# Patient Record
Sex: Male | Born: 1950 | Race: Black or African American | Hispanic: No | Marital: Married | Smoking: Former smoker
Health system: Southern US, Community
[De-identification: ages and names within clinical notes are randomized; demographics above are authoritative.]

## PROBLEM LIST (undated history)

## (undated) DIAGNOSIS — M109 Gout, unspecified: Secondary | ICD-10-CM

## (undated) DIAGNOSIS — E039 Hypothyroidism, unspecified: Secondary | ICD-10-CM

## (undated) DIAGNOSIS — E785 Hyperlipidemia, unspecified: Secondary | ICD-10-CM

## (undated) DIAGNOSIS — I2510 Atherosclerotic heart disease of native coronary artery without angina pectoris: Secondary | ICD-10-CM

## (undated) DIAGNOSIS — I4891 Unspecified atrial fibrillation: Secondary | ICD-10-CM

## (undated) DIAGNOSIS — N189 Chronic kidney disease, unspecified: Secondary | ICD-10-CM

## (undated) DIAGNOSIS — I619 Nontraumatic intracerebral hemorrhage, unspecified: Secondary | ICD-10-CM

## (undated) DIAGNOSIS — I10 Essential (primary) hypertension: Secondary | ICD-10-CM

## (undated) DIAGNOSIS — I639 Cerebral infarction, unspecified: Secondary | ICD-10-CM

## (undated) HISTORY — PX: CARDIAC CATHETERIZATION: SHX172

---

## 2000-04-25 ENCOUNTER — Encounter: Payer: Self-pay | Admitting: Emergency Medicine

## 2000-04-26 ENCOUNTER — Inpatient Hospital Stay (HOSPITAL_COMMUNITY): Admission: EM | Admit: 2000-04-26 | Discharge: 2000-04-28 | Payer: Self-pay | Admitting: Emergency Medicine

## 2001-06-18 ENCOUNTER — Emergency Department (HOSPITAL_COMMUNITY): Admission: EM | Admit: 2001-06-18 | Discharge: 2001-06-18 | Payer: Self-pay

## 2001-10-17 ENCOUNTER — Encounter: Payer: Self-pay | Admitting: Emergency Medicine

## 2001-10-17 ENCOUNTER — Inpatient Hospital Stay (HOSPITAL_COMMUNITY): Admission: EM | Admit: 2001-10-17 | Discharge: 2001-10-18 | Payer: Self-pay | Admitting: Emergency Medicine

## 2001-10-18 ENCOUNTER — Encounter: Payer: Self-pay | Admitting: General Surgery

## 2001-10-22 ENCOUNTER — Encounter: Payer: Self-pay | Admitting: General Surgery

## 2001-10-22 ENCOUNTER — Ambulatory Visit (HOSPITAL_COMMUNITY): Admission: RE | Admit: 2001-10-22 | Discharge: 2001-10-22 | Payer: Self-pay | Admitting: General Surgery

## 2001-10-30 ENCOUNTER — Encounter: Payer: Self-pay | Admitting: General Surgery

## 2001-10-30 ENCOUNTER — Ambulatory Visit (HOSPITAL_COMMUNITY): Admission: RE | Admit: 2001-10-30 | Discharge: 2001-10-30 | Payer: Self-pay | Admitting: General Surgery

## 2001-11-08 ENCOUNTER — Emergency Department (HOSPITAL_COMMUNITY): Admission: EM | Admit: 2001-11-08 | Discharge: 2001-11-08 | Payer: Self-pay | Admitting: Emergency Medicine

## 2001-11-16 ENCOUNTER — Ambulatory Visit (HOSPITAL_COMMUNITY): Admission: RE | Admit: 2001-11-16 | Discharge: 2001-11-16 | Payer: Self-pay | Admitting: Specialist

## 2001-11-16 ENCOUNTER — Encounter: Payer: Self-pay | Admitting: Specialist

## 2002-01-12 ENCOUNTER — Encounter: Payer: Self-pay | Admitting: Emergency Medicine

## 2002-01-12 ENCOUNTER — Emergency Department (HOSPITAL_COMMUNITY): Admission: EM | Admit: 2002-01-12 | Discharge: 2002-01-12 | Payer: Self-pay | Admitting: Emergency Medicine

## 2002-05-20 ENCOUNTER — Emergency Department (HOSPITAL_COMMUNITY): Admission: EM | Admit: 2002-05-20 | Discharge: 2002-05-20 | Payer: Self-pay | Admitting: Emergency Medicine

## 2002-11-11 ENCOUNTER — Encounter: Payer: Self-pay | Admitting: *Deleted

## 2002-11-12 ENCOUNTER — Inpatient Hospital Stay (HOSPITAL_COMMUNITY): Admission: EM | Admit: 2002-11-12 | Discharge: 2002-11-14 | Payer: Self-pay | Admitting: *Deleted

## 2002-11-12 ENCOUNTER — Encounter (INDEPENDENT_AMBULATORY_CARE_PROVIDER_SITE_OTHER): Payer: Self-pay | Admitting: Cardiology

## 2002-11-12 ENCOUNTER — Encounter: Payer: Self-pay | Admitting: Neurology

## 2002-11-26 ENCOUNTER — Encounter: Admission: RE | Admit: 2002-11-26 | Discharge: 2002-11-28 | Payer: Self-pay | Admitting: Neurology

## 2003-05-20 ENCOUNTER — Encounter: Admission: RE | Admit: 2003-05-20 | Discharge: 2003-05-20 | Payer: Self-pay | Admitting: Internal Medicine

## 2006-04-07 ENCOUNTER — Ambulatory Visit: Payer: Self-pay | Admitting: Pulmonary Disease

## 2006-04-07 ENCOUNTER — Encounter (INDEPENDENT_AMBULATORY_CARE_PROVIDER_SITE_OTHER): Payer: Self-pay | Admitting: Cardiovascular Disease

## 2006-04-07 ENCOUNTER — Inpatient Hospital Stay (HOSPITAL_COMMUNITY): Admission: EM | Admit: 2006-04-07 | Discharge: 2006-04-12 | Payer: Self-pay | Admitting: Emergency Medicine

## 2006-08-02 ENCOUNTER — Inpatient Hospital Stay (HOSPITAL_COMMUNITY): Admission: AD | Admit: 2006-08-02 | Discharge: 2006-08-09 | Payer: Self-pay | Admitting: Cardiovascular Disease

## 2006-08-03 ENCOUNTER — Encounter (INDEPENDENT_AMBULATORY_CARE_PROVIDER_SITE_OTHER): Payer: Self-pay | Admitting: Cardiovascular Disease

## 2007-05-02 ENCOUNTER — Inpatient Hospital Stay (HOSPITAL_COMMUNITY): Admission: EM | Admit: 2007-05-02 | Discharge: 2007-05-08 | Payer: Self-pay | Admitting: Emergency Medicine

## 2007-05-07 ENCOUNTER — Encounter (INDEPENDENT_AMBULATORY_CARE_PROVIDER_SITE_OTHER): Payer: Self-pay | Admitting: Cardiovascular Disease

## 2007-05-07 ENCOUNTER — Ambulatory Visit: Payer: Self-pay | Admitting: Vascular Surgery

## 2008-05-18 ENCOUNTER — Inpatient Hospital Stay (HOSPITAL_COMMUNITY): Admission: AD | Admit: 2008-05-18 | Discharge: 2008-05-23 | Payer: Self-pay | Admitting: Neurology

## 2008-05-19 ENCOUNTER — Encounter (INDEPENDENT_AMBULATORY_CARE_PROVIDER_SITE_OTHER): Payer: Self-pay | Admitting: Cardiovascular Disease

## 2008-05-21 ENCOUNTER — Ambulatory Visit: Payer: Self-pay | Admitting: Physical Medicine & Rehabilitation

## 2008-05-23 ENCOUNTER — Inpatient Hospital Stay (HOSPITAL_COMMUNITY)
Admission: RE | Admit: 2008-05-23 | Discharge: 2008-06-06 | Payer: Self-pay | Admitting: Physical Medicine & Rehabilitation

## 2008-07-15 ENCOUNTER — Encounter
Admission: RE | Admit: 2008-07-15 | Discharge: 2008-10-13 | Payer: Self-pay | Admitting: Physical Medicine & Rehabilitation

## 2008-07-16 ENCOUNTER — Ambulatory Visit: Payer: Self-pay | Admitting: Physical Medicine & Rehabilitation

## 2008-08-27 ENCOUNTER — Ambulatory Visit: Payer: Self-pay | Admitting: Physical Medicine & Rehabilitation

## 2008-12-22 ENCOUNTER — Encounter
Admission: RE | Admit: 2008-12-22 | Discharge: 2009-03-22 | Payer: Self-pay | Admitting: Physical Medicine & Rehabilitation

## 2008-12-24 ENCOUNTER — Ambulatory Visit: Payer: Self-pay | Admitting: Physical Medicine & Rehabilitation

## 2009-02-13 ENCOUNTER — Ambulatory Visit: Payer: Self-pay | Admitting: Physical Medicine & Rehabilitation

## 2009-03-24 ENCOUNTER — Encounter
Admission: RE | Admit: 2009-03-24 | Discharge: 2009-03-27 | Payer: Self-pay | Admitting: Physical Medicine & Rehabilitation

## 2009-03-27 ENCOUNTER — Ambulatory Visit: Payer: Self-pay | Admitting: Physical Medicine & Rehabilitation

## 2009-04-15 ENCOUNTER — Inpatient Hospital Stay (HOSPITAL_COMMUNITY): Admission: EM | Admit: 2009-04-15 | Discharge: 2009-04-17 | Payer: Self-pay | Admitting: Emergency Medicine

## 2009-04-16 ENCOUNTER — Encounter (INDEPENDENT_AMBULATORY_CARE_PROVIDER_SITE_OTHER): Payer: Self-pay | Admitting: Cardiovascular Disease

## 2009-07-07 ENCOUNTER — Encounter
Admission: RE | Admit: 2009-07-07 | Discharge: 2009-07-09 | Payer: Self-pay | Admitting: Physical Medicine & Rehabilitation

## 2009-07-07 ENCOUNTER — Ambulatory Visit: Payer: Self-pay | Admitting: Physical Medicine & Rehabilitation

## 2009-09-28 ENCOUNTER — Encounter
Admission: RE | Admit: 2009-09-28 | Discharge: 2009-10-12 | Payer: Self-pay | Admitting: Physical Medicine & Rehabilitation

## 2009-09-29 ENCOUNTER — Ambulatory Visit: Payer: Self-pay | Admitting: Physical Medicine & Rehabilitation

## 2009-10-12 ENCOUNTER — Ambulatory Visit: Payer: Self-pay | Admitting: Physical Medicine & Rehabilitation

## 2010-01-29 ENCOUNTER — Encounter
Admission: RE | Admit: 2010-01-29 | Discharge: 2010-04-29 | Payer: Self-pay | Admitting: Physical Medicine & Rehabilitation

## 2010-02-08 ENCOUNTER — Ambulatory Visit: Payer: Self-pay | Admitting: Physical Medicine & Rehabilitation

## 2010-03-16 ENCOUNTER — Ambulatory Visit: Payer: Self-pay | Admitting: Physical Medicine & Rehabilitation

## 2010-03-22 ENCOUNTER — Emergency Department (HOSPITAL_COMMUNITY): Admission: EM | Admit: 2010-03-22 | Discharge: 2010-03-22 | Payer: Self-pay | Admitting: Emergency Medicine

## 2010-04-14 IMAGING — CR DG CHEST 1V PORT
1 series · 1 of 1 positions shown · non-contrast
Comparison: 05/01/2007

CLINICAL DATA: Fever.  Stroke.

PORTABLE CHEST - 1 VIEW

[view not recorded]
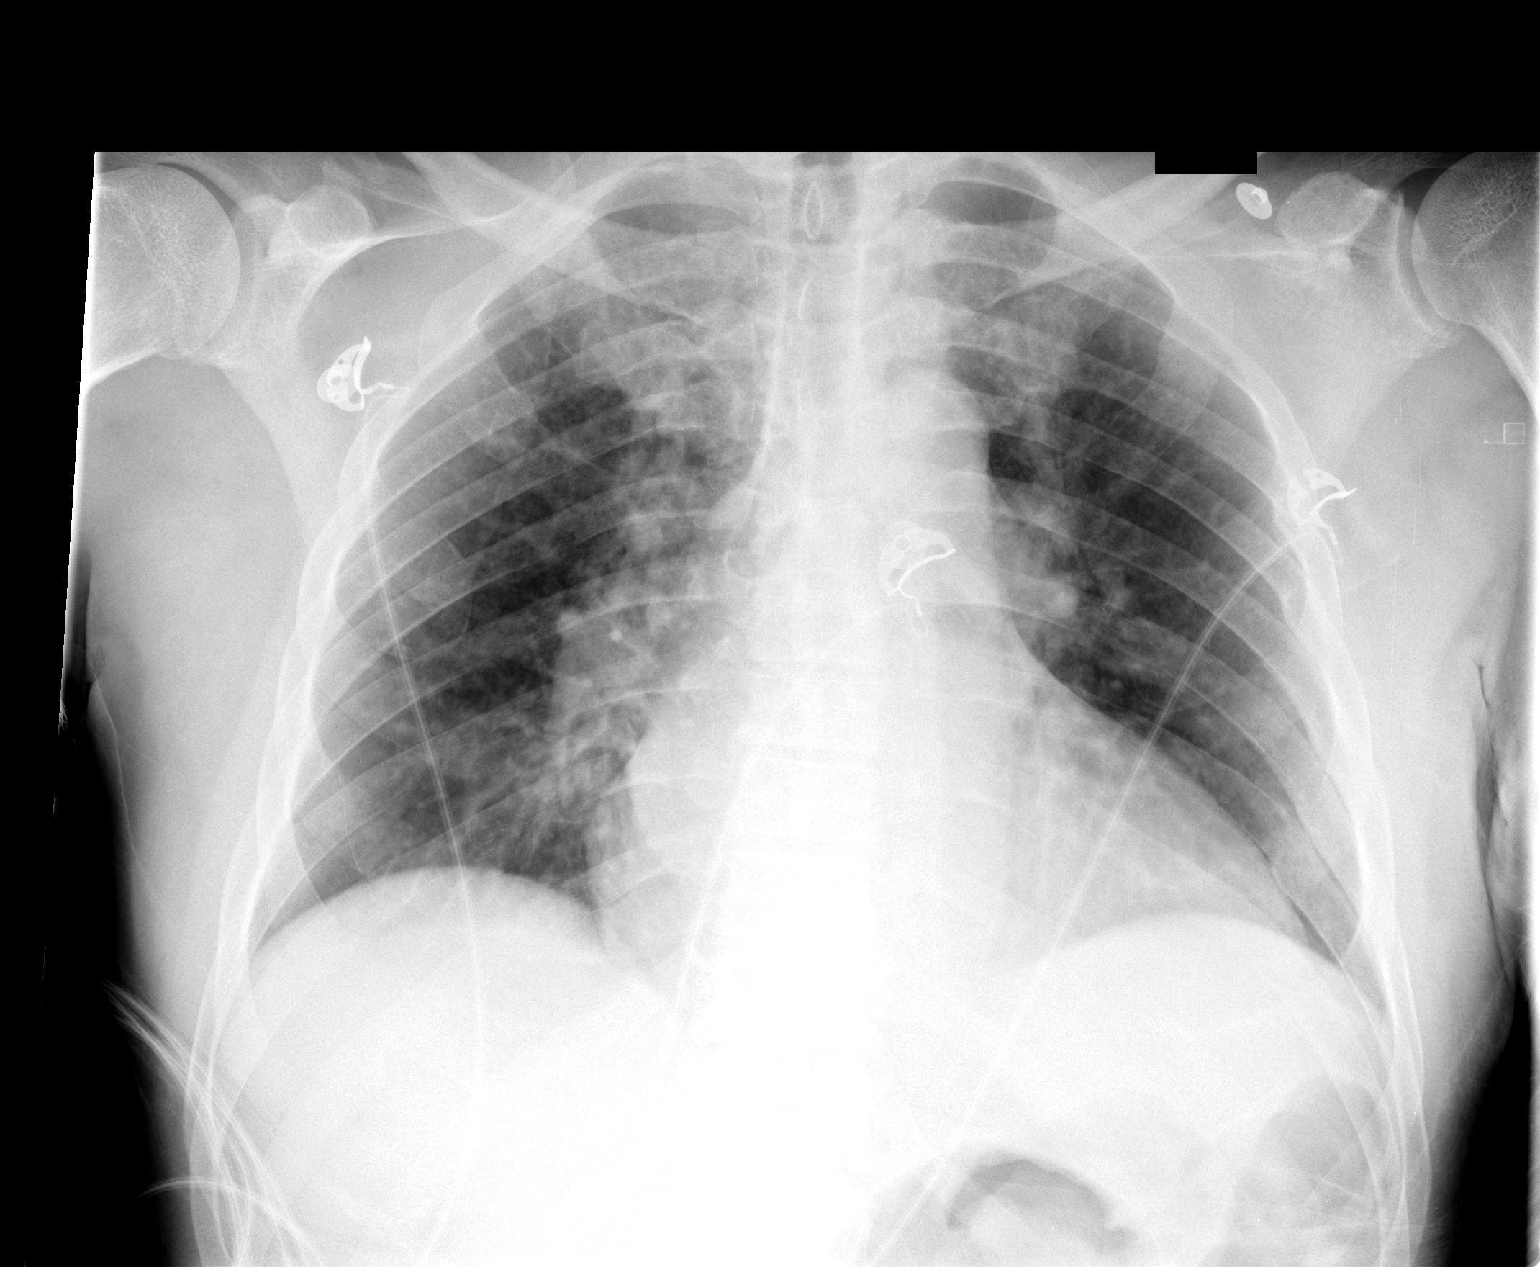

[1 of 1 positions shown; findings below may reference images not displayed]

FINDINGS: Remote left-sided rib fractures. Midline trachea.
Moderate cardiomegaly accentuated by AP portable technique. No
pleural effusion or pneumothorax. Low lung volumes with resultant
pulmonary interstitial prominence.  Minimal right base atelectasis.
No congestive failure.
IMPRESSION: Cardiomegaly and low lung volumes without acute disease.

## 2010-04-15 IMAGING — CT CT HEAD W/O CM
1 of 2 series · 13 of 30 positions shown, 17 images · non-contrast
Comparison: 05/19/2008

CLINICAL DATA: Stroke.  Follow-up CVA.

CT HEAD WITHOUT CONTRAST
TECHNIQUE: Contiguous axial images were obtained from the base of
the skull through the vertex without contrast.

[Series 2: brain · axial · 0.47mm/px · z∈[+111,+253]mm · 13 of 32 slices shown, 17 images]
[im 3/32  brain]
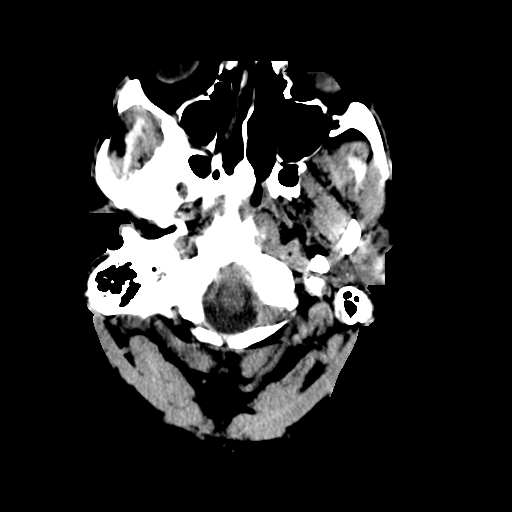
[im 3/32  bone]
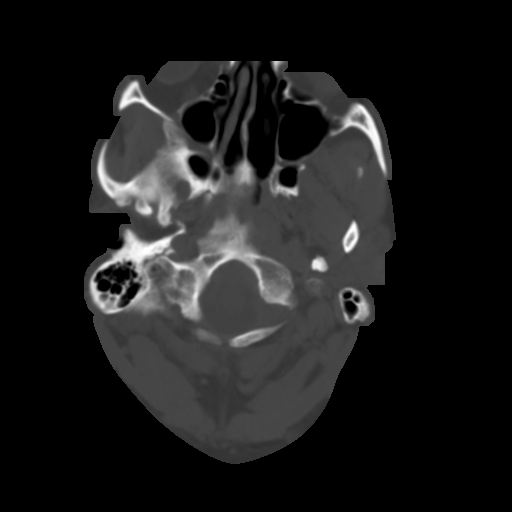
[im 5/32  brain]
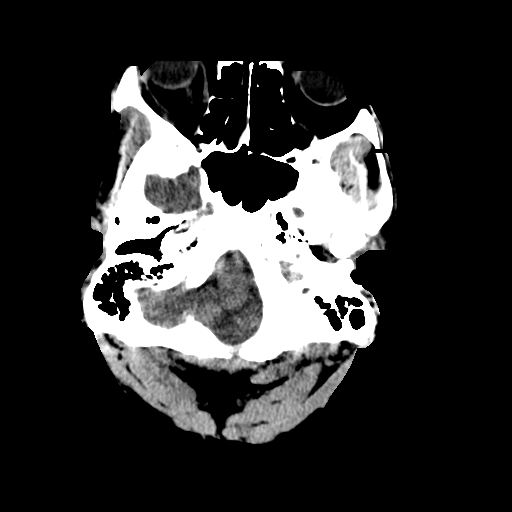
[im 7/32  brain]
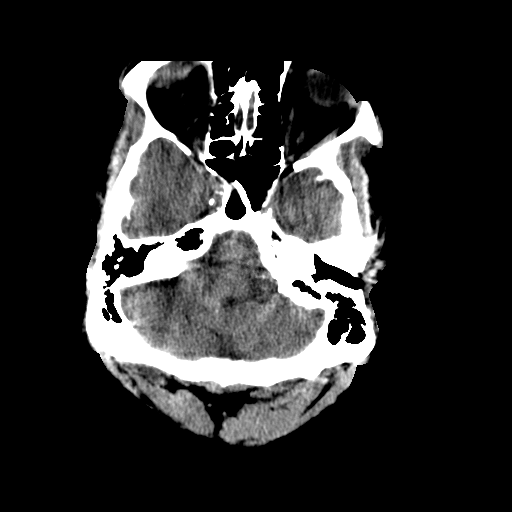
[im 9/32  brain]
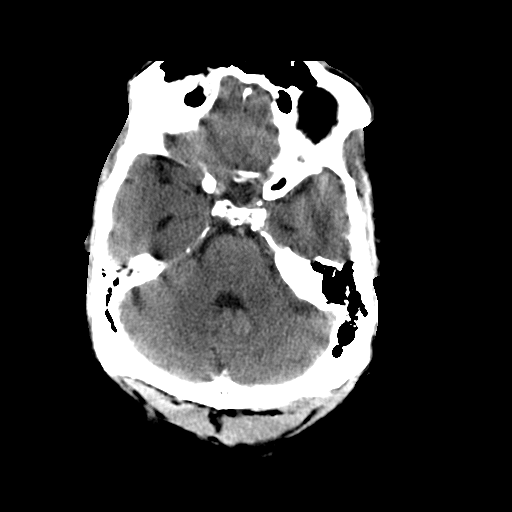
[im 12/32  brain]
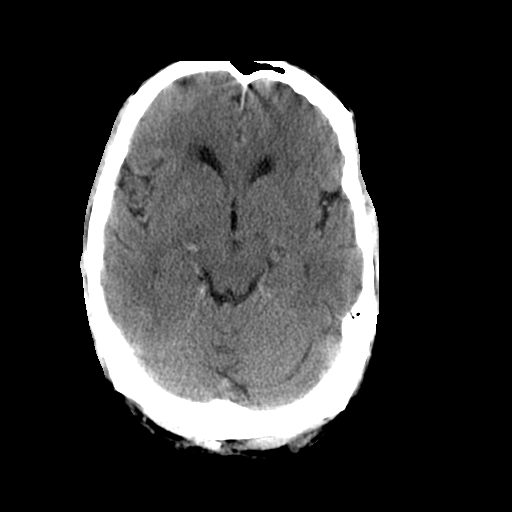
[im 12/32  bone]
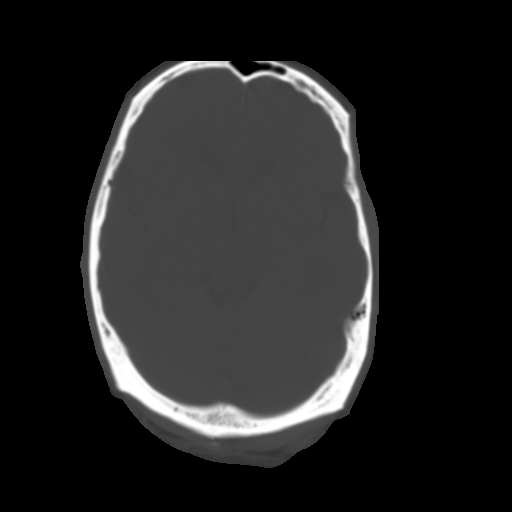
[im 14/32  brain]
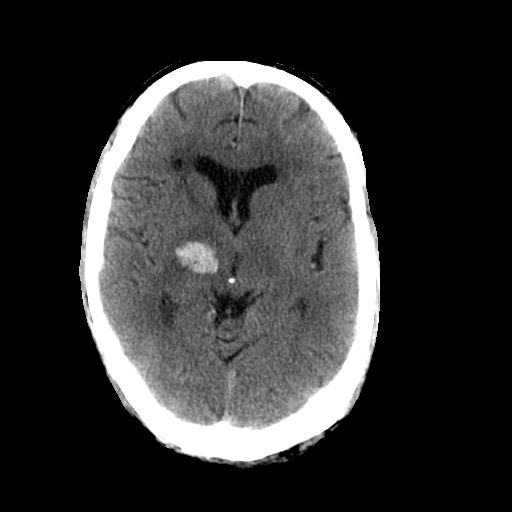
[im 16/32  brain]
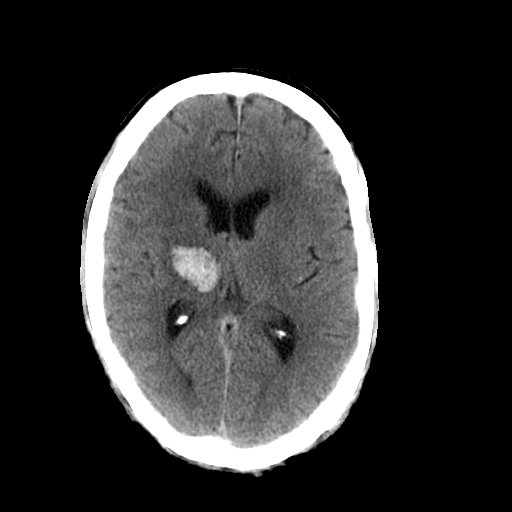
[im 18/32  brain]
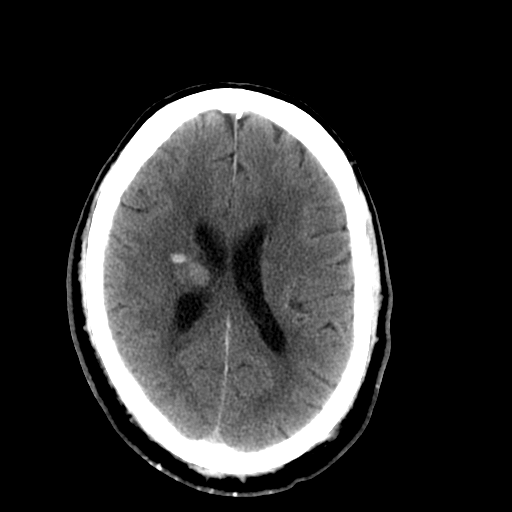
[im 20/32  brain]
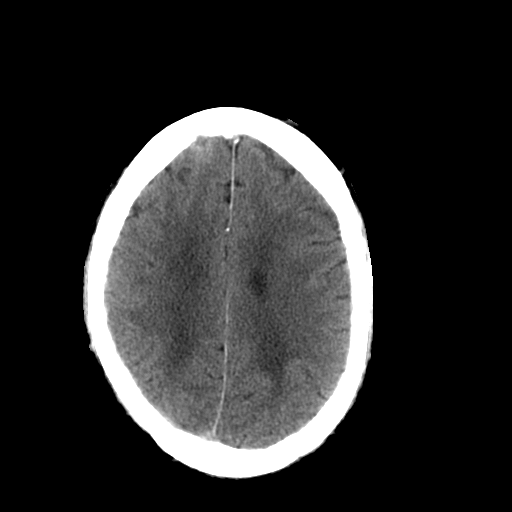
[im 20/32  bone]
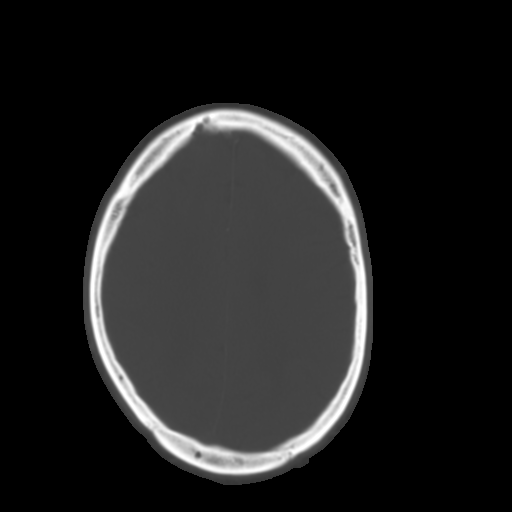
[im 23/32  brain]
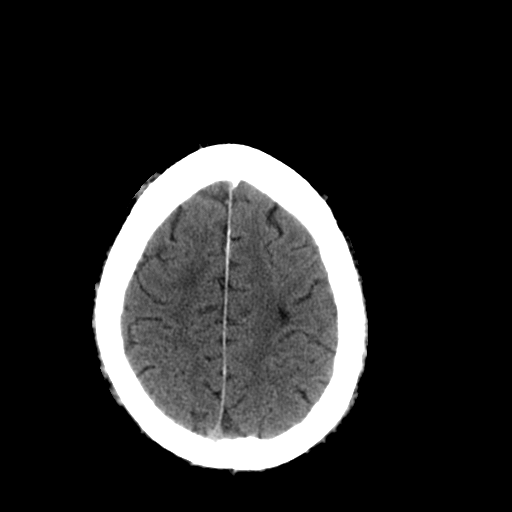
[im 25/32  brain]
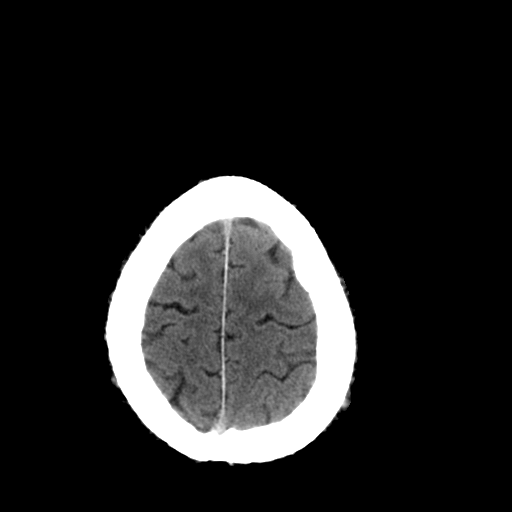
[im 27/32  brain]
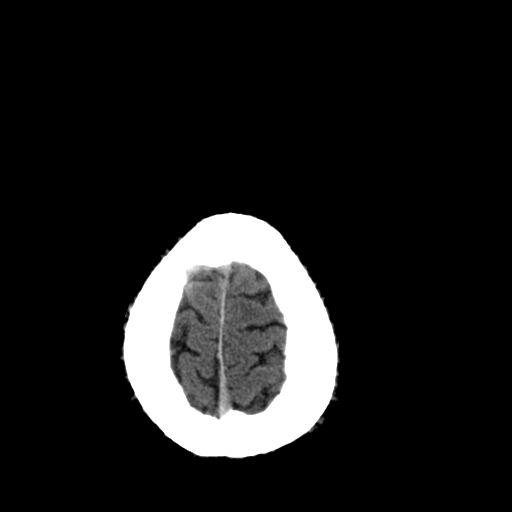
[im 29/32  brain]
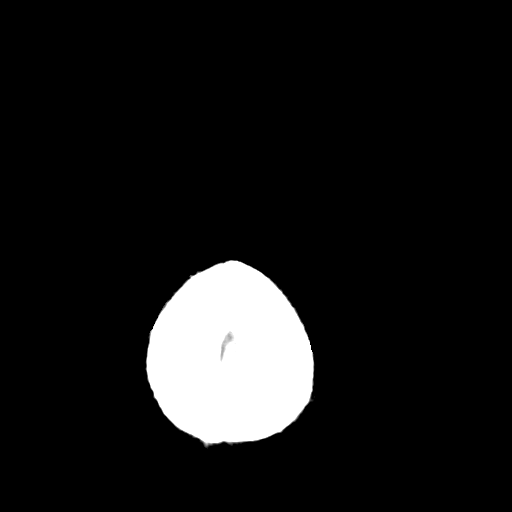
[im 29/32  bone]
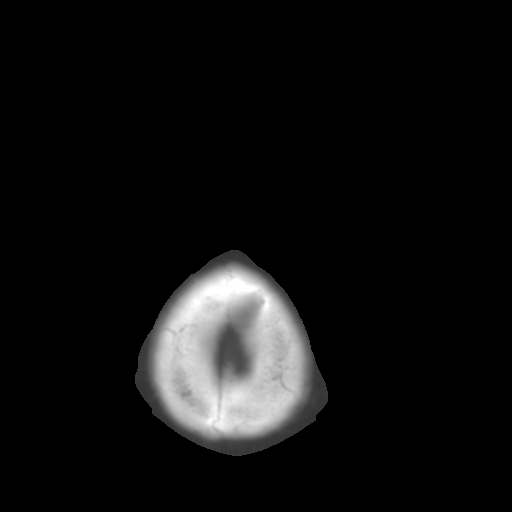

[13 of 30 positions shown; findings below may reference images not displayed]

FINDINGS: Right thalamic hemorrhage is unchanged, when allowing for
differences in plane of scan and technique.  Scattered lacunar
infarctions again noted.  Chronic ischemic white matter disease.
Ventricular size and configuration unchanged.
IMPRESSION: Stable appearance of right thalamic parenchymal hematoma.
Otherwise stable appearance of the brain.

## 2010-06-04 ENCOUNTER — Encounter
Admission: RE | Admit: 2010-06-04 | Discharge: 2010-08-17 | Payer: Self-pay | Source: Home / Self Care | Attending: Physical Medicine & Rehabilitation | Admitting: Physical Medicine & Rehabilitation

## 2010-06-14 ENCOUNTER — Ambulatory Visit: Payer: Self-pay | Admitting: Physical Medicine & Rehabilitation

## 2010-09-30 LAB — CBC
Hemoglobin: 15.1 g/dL (ref 13.0–17.0)
MCH: 29.8 pg (ref 26.0–34.0)
MCHC: 33.7 g/dL (ref 30.0–36.0)
Platelets: 259 10*3/uL (ref 150–400)
WBC: 6.2 10*3/uL (ref 4.0–10.5)

## 2010-09-30 LAB — DIFFERENTIAL
Basophils Relative: 0 % (ref 0–1)
Eosinophils Absolute: 0 10*3/uL (ref 0.0–0.7)
Eosinophils Relative: 0 % (ref 0–5)
Lymphocytes Relative: 17 % (ref 12–46)
Monocytes Relative: 12 % (ref 3–12)
Neutro Abs: 4.3 10*3/uL (ref 1.7–7.7)

## 2010-09-30 LAB — BASIC METABOLIC PANEL
BUN: 23 mg/dL (ref 6–23)
Creatinine, Ser: 1.95 mg/dL — ABNORMAL HIGH (ref 0.4–1.5)
GFR calc non Af Amer: 35 mL/min — ABNORMAL LOW (ref >60)
Potassium: 4 mEq/L (ref 3.5–5.1)
Sodium: 137 mEq/L (ref 135–145)

## 2010-09-30 LAB — SEDIMENTATION RATE: Sed Rate: 57 mm/hr — ABNORMAL HIGH (ref 0–16)

## 2010-10-21 LAB — CBC
Hemoglobin: 16.5 g/dL (ref 13.0–17.0)
RBC: 5.83 MIL/uL — ABNORMAL HIGH (ref 4.22–5.81)
RDW: 16.4 % — ABNORMAL HIGH (ref 11.5–15.5)

## 2010-10-21 LAB — BASIC METABOLIC PANEL
Calcium: 9.3 mg/dL (ref 8.4–10.5)
GFR calc Af Amer: 36 mL/min — ABNORMAL LOW (ref >60)
GFR calc non Af Amer: 30 mL/min — ABNORMAL LOW (ref >60)
Sodium: 139 mEq/L (ref 135–145)

## 2010-10-22 LAB — BASIC METABOLIC PANEL
BUN: 23 mg/dL (ref 6–23)
Calcium: 8.9 mg/dL (ref 8.4–10.5)
Creatinine, Ser: 1.93 mg/dL — ABNORMAL HIGH (ref 0.4–1.5)
GFR calc Af Amer: 43 mL/min — ABNORMAL LOW (ref >60)
GFR calc Af Amer: 57 mL/min — ABNORMAL LOW (ref >60)
GFR calc non Af Amer: 47 mL/min — ABNORMAL LOW (ref >60)
Glucose, Bld: 100 mg/dL — ABNORMAL HIGH (ref 70–99)
Potassium: 3.9 mEq/L (ref 3.5–5.1)
Sodium: 138 mEq/L (ref 135–145)

## 2010-10-22 LAB — CBC
HCT: 53.6 % — ABNORMAL HIGH (ref 39.0–52.0)
Hemoglobin: 17.6 g/dL — ABNORMAL HIGH (ref 13.0–17.0)
RBC: 6.26 MIL/uL — ABNORMAL HIGH (ref 4.22–5.81)
RDW: 17.5 % — ABNORMAL HIGH (ref 11.5–15.5)

## 2010-10-22 LAB — DIFFERENTIAL
Basophils Relative: 1 % (ref 0–1)
Eosinophils Absolute: 0 10*3/uL (ref 0.0–0.7)
Eosinophils Relative: 1 % (ref 0–5)
Lymphocytes Relative: 59 % — ABNORMAL HIGH (ref 12–46)
Monocytes Relative: 14 % — ABNORMAL HIGH (ref 3–12)
Neutro Abs: 0.9 10*3/uL — ABNORMAL LOW (ref 1.7–7.7)

## 2010-10-22 LAB — PATHOLOGIST SMEAR REVIEW

## 2010-10-22 LAB — POCT CARDIAC MARKERS
CKMB, poc: 2.7 ng/mL (ref 1.0–8.0)
Myoglobin, poc: 125 ng/mL (ref 12–200)

## 2010-10-22 LAB — CARDIAC PANEL(CRET KIN+CKTOT+MB+TROPI)
CK, MB: 2.8 ng/mL (ref 0.3–4.0)
Relative Index: 2 (ref 0.0–2.5)
Relative Index: 2.4 (ref 0.0–2.5)
Total CK: 121 U/L (ref 7–232)
Total CK: 139 U/L (ref 7–232)
Troponin I: 0.04 ng/mL (ref 0.00–0.06)
Troponin I: 0.05 ng/mL (ref 0.00–0.06)

## 2010-10-22 LAB — BRAIN NATRIURETIC PEPTIDE: Pro B Natriuretic peptide (BNP): 430 pg/mL — ABNORMAL HIGH (ref 0.0–100.0)

## 2010-10-22 LAB — PROTIME-INR
INR: 1.2 (ref 0.00–1.49)
Prothrombin Time: 14.7 seconds (ref 11.6–15.2)

## 2010-11-30 NOTE — Discharge Summary (Signed)
[name], [name]            ACCOUNT NO.:  [id number]   MEDICAL RECORD NO.:  [id number]          PATIENT TYPE:  IPS   LOCATION:  4034                         FACILITY:  [facility]   PHYSICIAN:  Ranelle Oyster, M.D.DATE OF BIRTH:  [birth year]   DATE OF ADMISSION:  05/23/2008  DATE OF DISCHARGE:  06/06/2008                               DISCHARGE SUMMARY   DISCHARGE DIAGNOSES:  1. Right thalamic hemorrhage.  2. History of right hemorrhagic infarction in 2004.  3. Hypertension.  4. Renal insufficiency.  5. Coronary artery disease with multiple stenting procedures.  6. Hyperlipidemia.  7. Gout.  8. Tobacco abuse.   This is a 60 year old African American male with history of coronary  artery disease, multiple stenting procedures, right brain infarction in  2004 with small hemorrhagic component with little residual, who was  admitted on May 18, 2008, with increased left-sided weakness and  slurred speech.  Blood pressure 227 systolic, 115.  Cranial CT scan with  a right thalamic hemorrhage 2.5 x 1.7 cm with mass effect.  Echocardiogram with ejection fraction 40% without emboli.  The patient  was on aspirin and Plavix prior to admission, which was held.  Blood  pressure monitored with multiple antihypertensive medications followed  by Dr. Algie Coffer.  History of gout remained on colchicine.  Modified  barium swallow on May 21, 2008, placed on a mechanical soft diet.  He was admitted for comprehensive rehab program.   PAST MEDICAL HISTORY:  See discharge diagnoses.  He does have a history  of tobacco use, remote alcohol.   SOCIAL HISTORY:  Lives with his wife in [place].  He is on disability.  Wife can assist as needed.  They live in one-level home, two steps to  entry.   FUNCTIONAL HISTORY:  Prior to admission, was independent in driving.  Functional status upon admission to rehab services was moderate assist  bed mobility.   ALLERGIES:  None.   MEDICATIONS PRIOR TO  ADMISSION:  1. Plavix 75 mg daily.  2. Clonidine 0.1 mg 3 times daily.  3. Toprol-XL 25 mg daily.  4. Aspirin 325 mg daily.  5. Caduet 5/40 daily.  6. Micardis 80/25 daily.  7. Isordil 10 mg 3 times daily.  8. Colchicine 0.6 mg daily.  9. Aldactone 25 mg daily.  10.Amiodarone 200 mg daily.   PHYSICAL EXAMINATION:  VITAL SIGNS:  Blood pressure 120 systolic 67,  pulse 82, temperature 97.9, and respirations 18.  GENERAL:  This is an alert male in no acute distress.  NEUROLOGIC:  Follow three-step commands, dense left-sided weakness.  Sensation decreased to light touch.  LUNGS:  Clear to auscultation.  CARDIAC:  Regular rate and rhythm.  ABDOMEN:  Soft and nontender.  Good bowel sounds.  EXTREMITIES:  Calves remain cool without any swelling, erythema, and  nontender.   REHABILITATION HOSPITAL COURSE:  The patient was admitted to inpatient  rehab services with therapies initiated on a 3-hour daily basis  consisting of physical therapy, occupational therapy, speech therapy,  and rehabilitation nursing.  The following issues were addressed during  the patient's rehabilitation stay.  Pertaining to [name]'s  right  thalamic hemorrhage, his aspirin and Plavix that he was on prior to  admission for a history of stroke as well as coronary artery disease  remained on hold secondary to hemorrhagic infarction.  His blood  pressures were monitored on multiple antihypertensive medications noted  on May 27, 2008.  Routine followup labs showed a creatinine of  2.42.  At that time, he was placed on intravenous fluids for hydration  as well as a hold on all diuretics including Aldactone, Lasix, and  hydrochlorothiazide.  He continued intravenous fluids until May 30, 2008, with latest creatinine of 1.96.  His Lasix was resumed at 20 mg  daily on May 30, 2008, as well as Aldactone 25 mg daily and monitor  his hydrochlorothiazide remained on hold.  Also with noted history of   gout, his colchicine was held for a short time due to some diarrhea,  this was later resumed.  All antihypertensive medications would be  addressed per Dr. Algie Coffer.  He did remain on his Caduet for  hyperlipidemia.  His heart rate remained well controlled.  He had no  increased shortness of breath.  Dr. Algie Coffer did follow up on May 31, 2008, for some mild chest tightness.  All cardiac enzymes were  negative.  No medication changes were made at that time.  Cardiac EKG  showed no acute processes.   Overall, for his functional mobility, he was minimum-to-moderate assist  for stand pivot transfers; close supervision for squat pivot transfers;  moderate assist for ambulation; minimal assist for bathing, toilet  transfers, and shower transfers; moderate assist toileting and lower  body dressing; supervision upper body dressing.  He required minimum-to-  moderate assist to use speech intelligibility strategies.  Routine  toileting was provided by rehab nursing.  Weekly collaborative  interdisciplinary team conferences were held to discuss the patient's  estimated length of stays, family teaching, any barriers to discharge  with arrangements to be discharged on June 06, 2008.  He had been  fitted with a custom AFO to the left lower extremity as well as resting  wrist/hand orthosis for nighttime use, left upper extremity per [facility].   Latest labs showed a sodium 138, potassium 4.2, BUN 33, creatinine 1.99,  which would need close monitoring as an outpatient.  Latest CBC with  hemoglobin 15.3, hematocrit 47.7.   DISCHARGE MEDICATIONS:  At the time of dictation included:  1. Isordil 10 mg 3 times daily.  2. Lopressor 25 mg twice daily.  3. Caduet 5/40 daily.  4. Amiodarone 200 mg daily.  5. Clonidine patch 0.2 mg change weekly.  6. Aldactone 25 mg daily.  7. Benicar 40 mg daily.  8. Lasix 20 mg daily.  9. Colchicine 0.6 mg daily.   His diet was soft.  He would follow  up Dr. Riley Kill at the outpatient  rehab service office as advised.  Dr. Algie Coffer [phone], medical  management.  Dr. [facility].  The patient should receive  close monitoring of renal function which could be addressed per Dr.  Algie Coffer with latest creatinine of 1.99.  His hydrochlorothiazide  temporarily remained on hold until renal function improved.      Mariam Dollar, P.A.      Ranelle Oyster, M.D.  Electronically Signed    DA/MEDQ  D:  06/05/2008  T:  06/05/2008  Job:  [id number]   cc:   Ranelle Oyster, M.D.  [name], M.D.  Noel Christmas, MD

## 2010-11-30 NOTE — Assessment & Plan Note (Signed)
[name] is back regarding his right thalamic hemorrhage.  He has been at  home with his wife.  In outpatient therapy, he has been doing quite well  to this point.  He is working on the arm range of motion, strengthening,  and balance.  He got some stimulation therapies as well with it.  He  denies pain today.  He has been eating well.  His moods have been  improved.  He saw Cardiology yesterday and his blood pressure has been  under good control as well.   REVIEW OF SYSTEMS:  The patient reports occasional limb swelling.  Otherwise, he denies any problems and full review is in the written  health and history section.   SOCIAL HISTORY:  The patient is married and living with his spouse.   PHYSICAL EXAMINATION:  VITAL SIGNS:  Blood pressure is 139/82, pulse is  70, respiratory rate 18, satting 98% on room air, and weight 194  pounds..  GENERAL:  The patient is pleasant, alert, and oriented x3.  Affect is  bright and appropriate.  He has some slight dysarthria still.  He has a  left central VII.  Left upper extremity strength is 2+/5 in the  shoulder; 2/5 in biceps, triceps, trace at the wrist, and trace to one  at the hand intrinsics.  Left lower extremity is 2-3/5 proximal to trace  to absent distally at the ankle.  Sensation is 1/2 in leg and arm.  Reflexes are 1+ on the left.  He has full passive movement today of all  joints on the left side.  Right side of the extremity and neurologic  exam is within normal limits.  Cognitively, he is appropriate.  Mood is  pleasant.  HEART: Regular.  CHEST: Clear.  ABDOMEN: Soft and nontender.   ASSESSMENT:  1. Right thalamic hemorrhage with left-sided hemiparesis and      hemisensory loss.  2. History of coronary artery disease and hypertension.  3. Gout.   PLAN:  1. The patient is doing very well at this point.  Continue with      therapy to improve gait and strengthening.  I think he will benefit      from functional electrical  stimulation for the arm and leg.  We may      look at another more dynamic brace to improve the efficiency of his      gait.  Stay with filed AFO for now.  2. I will refill Catapres patch as Dr. Algie Coffer did not do this      yesterday.  He should continue with blood pressure regimen per Dr.      Roseanne Kaufman recommendations.  3. I will see him back about 6 weeks time to follow up progress.      Overall, he is doing quite nicely.      Ranelle Oyster, M.D.  Electronically Signed     ZTS/MedQ  D:  07/16/2008 12:51:38  T:  07/17/2008 05:14:29  Job #:  [id number]   cc:   [name], M.D.  Fax: [phone]

## 2010-11-30 NOTE — Assessment & Plan Note (Signed)
[name] is back regarding his right thalamic hemorrhage.  He has done  quite well with the therapy.  He continues to get some return in the leg  and ulnar.  They are doing Bioness to the left upper extremity and they  have seen some improvement in his left hand function.  He is having some  swelling there still, but is happy with some of the progress.  He is  wearing his left solid AFO with a small heel wedge and seems in the most  part to be doing better.  Blood pressure is under better control.  He  follows up with Dr. Algie Coffer in this regard.  The patient has questions  about whether driving.  Therapy reports that is independent with bathing  and dressing except for the socks and shoes.  The patient denies pain  today.  He says he can walk about 60 minutes without having to stop.   REVIEW OF SYSTEMS:  Notable for loss of taste, some decrease in mood,  poor appetite at time.  Other pertinent positives are above and full  review is in the 14-point health history section.   SOCIAL HISTORY:  The patient is married and lives with his wife.  Remains extremely supportive.   PHYSICAL EXAMINATION:  VITAL SIGNS:  Blood pressure is 115/68, pulse is  65, respiratory rate 18.  He is sating 98% on room air.  GENERAL:  The patient is pleasant, alert, and oriented x3.  Speech is  clear.  He still has some mild left central VII, but minimal at this  point.  Sensation remains slightly diminished at 1/2 in the left arm and  leg.  His strength in left upper extremity is still about 2+/5 in the  shoulder 2+/5, biceps and triceps.  He had trace movement to 1/5 moving  the hand and wrist.  Left lower extremity is 3/5 proximally and distally  has plantar flexion at 2/5 and trace ankle dorsiflexion.  Ray seems to  be fitting appropriately.  When he walks, he tends to lift left hip and  circum ducts the foot to help clear it.  He has good knee stability  overall.  Cognitively, I thought it was appropriate.   Mood was good.  HEART:  Regular.  CHEST:  Clear.  ABDOMEN:  Soft, nontender.   ASSESSMENT:  1. Right thalamic hemorrhage with left-sided hemiparesis, hemisensory      loss.  2. History of coronary artery disease and hypertension.  3. Gout.   PLAN:  1. I think the patient might benefit from a long-term Bioness unit to      promote use of the left hand and wrist and improve muscle tone,      prevent contracture, and encourage further neurological recovery.      I asked him to speak with his occupational therapist and wrote her      note as well.  2. I have the patient see at [facility] regarding dorsiflexion      assist AFO.  This may benefit in terms of his gait quality.  I      think he has a knee control to accommodate this.  3. Followup with Dr. Algie Coffer regarding heart and blood pressure      issues.  4. I will see him back in about 4 months' time.  I am very pleased      with his progress.      Ranelle Oyster, M.D.  Electronically Signed  ZTS/MedQ  D:  08/27/2008 13:53:59  T:  08/28/2008 01:31:56  Job #:  [id number]   cc:   [name], M.D.  Fax: [phone]

## 2010-11-30 NOTE — Discharge Summary (Signed)
[name], [name]            ACCOUNT NO.:  [id number]   MEDICAL RECORD NO.:  [id number]          PATIENT TYPE:  INP   LOCATION:  3034                         FACILITY:  [facility]   PHYSICIAN:  Melvyn Novas, M.D.  DATE OF BIRTH:  08/08/1950   DATE OF ADMISSION:  05/18/2008  DATE OF DISCHARGE:  05/23/2008                               DISCHARGE SUMMARY   DIAGNOSES AT TIME OF DISCHARGE:  1. Right thalamic hemorrhage likely secondary to uncontrolled      hypertension.  2. Hypokalemia, resolved.  3. Insomnia during hospitalization.  4. Coronary artery disease with multiple myocardial infarctions.  5. Coronary angioplasty as well as stent placements.  6. Hypertension.  7. Congestive heart failure.  8. Gout.  9. History of urinary tract infections.   MEDICINES AT TIME OF DISCHARGE:  1. Amiodarone 200 mg a day.  2. Lopressor 25 mg b.i.d.  3. Caduet 5 mg/40 mg a day.  4. Pacerone 200 mg a day.  5. Micardis 80/25 mg a day.  6. Isordil 10 mg t.i.d.  7. Colchicine 0.6 mg b.i.d.  8. Aldactone 25 mg a day.  9. Indomethacin 50 mg t.i.d.  10.Lasix 20 mg a day.  11.Catapres patch 0.2 mg q. 7 days.   STUDIES PERFORMED:  1. CT of the brain on admission shows a 2.5 x 1.7 cm right thalamic      hematoma with mild surrounding vasogenic edema, extensive chronic      small vessel insults elsewhere throughout the brain.  2. Chest x-ray shows cardiomegaly and low long volumes without acute      disease.  3. Follow-up CT shows stable right thalamic hematoma.  4. A 2-D echocardiogram shows EF of 40% with diffuse left ventricular      hypokinesis.  No obvious source of embolus.  Carotid Doppler not      performed this admission, last on May 07, 2007 shows no ICA      stenosis.  5. EKG shows normal sinus rhythm, left axis deviation, left      ventricular hypertrophy with QRS widening, ST and T-wave      abnormality, prolonged QT when compared with previous EKG, question      lateral  ischemic changes.   LABORATORY STUDIES:  Urine culture shows no growth.  Blood culture shows  no growth x2.  Cholesterol 106, triglycerides 52, HDL 22, LDL 74.  Chemistry with potassium 3.4, glucose 117, creatinine 1.58.  Cardiac  enzymes negative x3.  Coagulations on admission were normal except for a  slightly elevated protime at 15.8 and CBC with white blood cells 5.87,  RDW 17.2, otherwise normal.   HISTORY OF PRESENT ILLNESS:  [name] is a 60 year old right-  handed African American male who experienced sudden onset of left-sided  severe weakness as well as left facial weakness and dysarthria.  The  patient has a history of previous lacunar infarcts affecting his left  side from which he recovered.  He had no headache at the onset of his  symptoms.  No hypertension.  No blood pressure on presentation to  [facility] was 227/115.  CT of the head done there showed an  acute right thalamic hemorrhage with mild mass effect, posterior horn of  lateral ventricle, but otherwise unremarkable.  He was transferred to  [facility] for further care as there were no Neurology Service  is available there on the weekend.  Upon arrival here, his CT was  unchanged.  The patient was on aspirin and Plavix prior to admission and  these were discontinued.  He was admitted to the hospital to the neuro  ICU with IV and a hypertensive for further management.  He was not a tPA  secondary to hemorrhage.   HOSPITAL COURSE:  Dr. Algie Coffer was consulted given his history of heart  disease.  He was assisted with his overall care and blood pressure  medicine during hospitalization.  Once he was able to take p.o.  medications, he was resumed on his home and a hypertensive as well as a  Catapres patch was added during his n.p.o. status.  There was some delay  in evaluating him for swallowing as the patient developed acute gout in  hospital and morphine was given for pain control.  He  became sleepy and  was unable to participate until 24 hours later when he actively passed a  swallow eval.  He was placed on Indocin at that time as well as  colchicine and his gout symptoms have vastly improved.  He is now on a  dysphagia 3 thin liquid diet and is tolerating that without  difficulties.  His blood pressure came down to normal range and he was  transferred to the floor.  He was started on Lovenox after the third day  for DVT prophylaxis.  He was evaluated by PT, OT, and speech and felt to  benefit from inpatient rehab.  Referral was made and approval was  received.  The patient will be transferred there for ongoing therapies.   CONDITION AT DISCHARGE:  The patient is alert and oriented x3.  Speech  is dysarthric.  No aphasia.  His eye movements are full.  His face is  asymmetric with left facial weakness.  He has left hematemesis 0/5 in  his arm and 1/5 in his leg.  There was ankle 0/5.  His heart rate is  regular.  His breath sounds are clear.   DISCHARGE PLAN:  1. Discharge to rehab for continuation of PT, OT, and speech therapy.  2. No antiplatelets at this time, may consider resumption after 1      month if hemorrhage remained stable.  3. Ongoing risk factor control including blood pressure, diabetes, and      lipids.  4. Follow up with Dr. Delia Heady after discharge in 2-3 weeks.      Follow up with primary care physician within 1 month of discharge.      Annie Main, N.P.      Melvyn Novas, M.D.  Electronically Signed    SB/MEDQ  D:  05/23/2008  T:  05/23/2008  Job:  [id number]   cc:   Pramod P. Pearlean Brownie, MD  [name], M.D.  Dr. Ludwig Clarks

## 2010-11-30 NOTE — H&P (Signed)
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   HISTORY OF PRESENT ILLNESS:  This is a 60 year old African American man  who experienced acute onset left-sided severe weakness as well as left  facial weakness and dysarthria, this afternoon.  The patient has a  history of previous lacunar infarctions are clinically affecting his  left side from which he recovered well.  There was no headache at the  onset of his symptoms.  The patient has no hypertension.  Blood pressure  on presentation to the Emergency Room at [facility], was 227/115.  He was noted to have severe left-sided weakness and slurred speech.  CT  of his head showed an acute right thalamic hemorrhagic lesion with mild  mass effect on posterior horn of lateral ventricle, but was otherwise  unremarkable.  There was no extension of hemorrhage into ventricular  system.  The patient's mental status was unchanged from normal.  Because  of lack of Neurology Services available in [facility] on weekends,  the patient was transferred to [facility] for further  evaluation and management.  He has been on aspirin 325 mg per day and  Plavix 75 mg per day for stroke prevention.  He has had no previous  hemorrhagic stroke.  In addition to acute thalamic stroke, CT scan also  showed multiple bilateral old deep white matter lacunar type  infarctions.  The patient's blood pressure on initial presentation at  [facility] was 227/115.  The patient was given IV labetalol with  significant reduction in blood pressure to 170/100 at the time of  discharge from [facility] and transferred to the [facility].   PAST MEDICAL HISTORY:  Remarkable for coronary artery disease and  multiple myocardial infarctions.  There is a history of coronary  angioplasty as well as stent placement.  EKG on presentation today  showed changes indicative of possible lateral ischemia as well as  possible acute subendocardial damage.  Other past history includes  hypertension, congestive heart failure, gout, and urinary tract  infection.   CURRENT MEDICATIONS:  1. Plavix 75 mg per day.  2. Clonidine 0.1 mg t.i.d.  3. Pacerone 200 mg per day.  4. Toprol-XL 25 mg per day.  5. Aspirin 325 mg per day.  6. Caduet 5/40 daily.  7. Micardis 80/25 two daily.  8. Isordil 10 mg t.i.d.  9. Colchicine 0.6 mg per day.  10.Aldactone 25 mg per day.   ALLERGIES:  No known drug allergies.   FAMILY HISTORY:  Negative for stroke, but strongly positive for  hypertension.   SOCIAL HISTORY:  The patient is married and lives with his wife.  He  still abuses tobacco.  He does  not drink alcohol at this point.   REVIEW OF SYSTEMS:  NEUROLOGICAL:  As above.  CARDIOVASCULAR:  As above  and no recent symptoms of cardiac ischemia, however.  PULMONARY:  Negative.  GI:  Negative.  GU:  Negative.  MUSCULOSKELETAL:  Negative.  ENDOCRINE:  Negative.  REPRODUCTIVE:  Negative.  HEPATOLOGIC/LYMPHATIC:  Negative.  IMMUNOLOGIC:  Negative.  PSYCHIATRIC:  Negative.   PHYSICAL EXAMINATION:  VITAL SIGNS:  Temperature was 98.4, pulse was 79  and regular, respirations are 24 per minute, blood pressure 167/98, and  oxygen saturation was 98%.  GENERAL:  General appearance was that of white middle-aged [place]  American man who was alert and cooperative and in no acute distress.  He  was well oriented to time as well as place.  His short term and long  term memory were normal.  HEENT:  Normal except for obvious facial weakness on the left.  NECK:  Supple with no masses or  tenderness.  LUNGS:  Clear to auscultation.  CARDIAC:  Revealed normal rate and rhythm.  Normal S1 and S2 and no  cardiac murmur.  ABDOMEN:  Soft and nontender.  Bowel sounds were normal.  GENITALIA:  Normal for an adult male.  EXTREMITIES:  Normal.  SKIN:  Normal.  PERIPHERAL PULSES:  Normal including dorsal pedis pulses.  RECTAL:  Deferred.  NEUROLOGICAL:  His pupils were equal and reactive to normal light.  Extraocular movements were full and conjugate.  Visual fields were  intact and normal.  There was no facial numbness.  He had moderately  severe left lower facial weakness.  Hearing was normal.  Speech was  moderately dysarthric.  Palatal movement was slightly reduced on the  left compared to the right.  MOTOR:  Complete paralysis of his left upper and lower extremities.  Strength of his right extremities was normal.  Muscle tone was flaccid  throughout.  Deep tendon reflexes were 1+ and symmetrical in the upper  extremities at the knees and trace to 1+ at the ankles.  Plantar  responses were mute.  Sensory examination showed reduced perception of  touch sensation over his left extremities comparing to the right.  Carotid auscultation was normal.   CLINICAL IMPRESSION:  1. 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  2. Old multiple cerebral infarctions.  3. Possible acute cardiac ischemic abnormalities.   PLAN:  1. No antiplatelet or anticoagulant intervention.  2. MRI in the a.m.  3. Cardiology consult tonight.  4. Physical therapy, occupational therapy, and speech therapy      intervention.      Noel Christmas, MD  Electronically Signed    CS/MEDQ  D:  05/18/2008  T:  05/19/2008  Job:  [id number]

## 2010-11-30 NOTE — Assessment & Plan Note (Signed)
[name] is back regarding his right thalamic hemorrhage.  He is really  doing quite well in the most part.  He complains of some weakness still  in the left upper extremity with decreased sensory loss throughout the  left arm and leg today.  He walks with his cane but often walks without.  He is walking without his AFO.  He has completed therapies.  He is  sleeping well and appetite is good.  He is following up with Dr. Algie Coffer  for his blood pressure.   REVIEW OF SYSTEMS:  Notable for the above.  Full 14-point review is in  the written health and history section of the chart.   SOCIAL HISTORY:  The patient is married and living with his wife.   PHYSICAL EXAMINATION:  Blood pressure is 134/87, pulse 74, respiratory  rate 18, he is sating 99% on room air.  The patient is pleasant, alert  and oriented x3.  Affect is generally bright and appropriate.  His  sensory exam remains 1/2 in the left arm and leg.  He does have  increased fine motor movement of the leg.  The patient's left upper  extremity strength is still impaired at 1 to 2+/5.  It may have been  increased to 1+, perhaps, in the left hand.  He has resting tone at the  wrist and fingers as well as elbow at 1+ to 2/4.  He has trace tone in  left lower extremity.  His strength is generally 3+ to 4/5 at the hip  and knee, 2 to 3/5 at the left ankle now.  The patient walks and tends  to have some recurvatum of the left knee but overall better control.  He  walked without his cane today and did quite nicely with good posture and  form.  Cognitively, he is appropriate.  He has some mild dysarthria  still.  Mood was excellent.  Heart has regular rate.  Chest is clear.  Abdomen is soft, nontender.   ASSESSMENT:  1. Right thalamic hemorrhage with left hemisensory loss and      hemiparesis.  2. History of coronary artery disease and hypertension.  3. Gout.   PLAN:  1. We will initiate trial of baclofen 5 mg b.i.d. and titrating up to      10 mg t.i.d. over 2 weeks.  The patient was asked to call me with      any problems or side effects.  We reviewed his side effects today.      He is to do range of motion exercises at home.  The patient may be      a baclofen candidate.  2. Blood pressure.  Follow up with Dr. Algie Coffer.  3. I will see him back in about 2 months' time.  In general, he has      done very well.      Ranelle Oyster, M.D.  Electronically Signed     ZTS/MedQ  D:  12/24/2008 09:32:20  T:  12/25/2008 00:32:39  Job #:  [id number]   cc:   [name], M.D.  Fax: [phone]

## 2010-11-30 NOTE — H&P (Signed)
[name], [name]            ACCOUNT NO.:  [id number]   MEDICAL RECORD NO.:  [id number]          PATIENT TYPE:  IPS   LOCATION:  4028                         FACILITY:  [facility]   PHYSICIAN:  Ranelle Oyster, M.D.DATE OF BIRTH:  [birth year]   DATE OF ADMISSION:  05/23/2008  DATE OF DISCHARGE:                              HISTORY & PHYSICAL   PRIMARY CARE [name]:  Donald Rodriguez, MD   CHIEF COMPLAINT:  Right thalamic hemorrhage.   HISTORY OF PRESENT ILLNESS:  This is a 60 year old African American male  with history of CAD and multiple stenting procedures, as well as  hypertension and CHF.  The patient had suffered a right brain infarct in  2004 with small hemorrhage but little residual deficits.  On May 18, 2008, he was admitted to the hospital with left-sided weakness and  slurred speech.  Blood pressure was 227/115.  Head CT is positive for a  right thalamic hemorrhage.  The hemorrhage measured 2.5 x 1.7 cm with  mass effect.  Echocardiogram revealed ejection fraction of 40%.  The  patient has had dyspnea on exertion.  The patient had been on aspirin  and Plavix prior to arrival but due to his hemorrhage, these medications  were held.  Blood pressure has been maintained with multiple  antihypertensives with followup regularly by Cardiology.  The patient is  on colchicine for gout b.i.d.  The patient had modified barium swallow  cleared in for D3 and thin liquid diet.  Rehab was consulted by the  primary team.  We felt that he could benefit from inpatient rehab  program and the patient thus was brought here today.   REVIEW OF SYSTEMS:  Notable for joint swelling and some pain at the  ankles and knees.  He report some dyspnea with exertion particularly  with his ADL and mobility tasks.  He denies dyspnea at rest.  He has had  no chest pain.  He has been sleeping for the most part fairly well.  He  has been emptying his bowels and bladder.  His full review is in the  written history and physical.   Past medical history is positive for  1. CAD with multiple stenting procedure.  Seizures with latest in      January 2008.  2. Gout.  3. Hypertension.  4. Hyperlipidemia.  5. CHF.  6. Left shoulder injury 2004.  7. Positive tobacco history and remote alcohol history.   Family history is positive for CAD and MI.   SOCIAL HISTORY:  The patient lives with his wife in [place] and on  disability.  Wife can assist as needed at home.  Live in one-to-one  house with two steps to enter.   FUNCTIONAL STATUS:  The patient was independent in driving prior to  arrival.  On rehab evaluation 2 days ago, he was mod-to-max assist for  bed mobility and transferring.  He walks.  Currently, he remains mod-to-  max assist with basic mobility.  His ADLs are max assist.   ALLERGIES:  None.   HOME MEDICATIONS:  Plavix, clonidine, Toprol, aspirin, Caduet, Micardis,  Isordil,  colchicine, Aldactone and amiodarone.   LABORATORY DATA:  Hemoglobin 15.2, white count 4.1, platelets 154,000.  Sodium 135, potassium 3.4, BUN 14, and creatinine 1.58.   PHYSICAL EXAMINATION:  VITAL SIGNS:  Blood pressure is 120/67, pulse 82,  respiratory rate 18, and temperature 97.9.  GENERAL:  The patient is pleasant, generally alert.  He is a bit flat.  HEENT:  Pupils are equal, round and reactive to light.  Ear, nose and  throat exam is notable for borderline dentition with missing teeth.  Mucosa is pink and moist.  NECK:  Supple without JVD or lymphadenopathy.  CHEST:  Notable for a few coarse rhonchi but no rales.  He had fair air  movement.  HEART:  Regular in rate and rhythm without murmur, rubs or gallops.  ABDOMEN:  Soft, nontender.  Bowel sounds are positive.  SKIN:  Notable for a few scars but nothing of significant nature.  NEUROLOGICAL:  Cranial nerves II-XII revealed a left central VII as well  as left tongue deviation.  He had intact sensation of the face.  He is  able to  track visually to all fields.  Reflexes are 1+ in the left and  1+ to 2+ in the right.  Sensation is grossly intact to pinprick and  light touch, as well as proprioception in left arm, leg and face.  Motor  exam is notable for 0/5 strength in left upper extremity.  He had 1/5  strength in the left hip, 1/5 at the left knee and 0 at left ankle.  Right lower extremity was 3-4/5 proximally to 4+/5 distally.  Right  upper extremities 4+-5/5 throughout.  Judgment was fair.  Orientation  was generally intact.  Memory was functional.  Mood was very flat.   ASSESSMENT AND PLAN:  1. Functional deficit secondary to right thalamic hemorrhage.  The      patient is admitted to the inpatient rehab unit today to receive      collaborative interdisciplinary care between the physiatrist, rehab      nursing staff, and therapy team.  The patient's level of medical      complexity and substantial therapy needs in context of that medical      necessity cannot be provided at a lesser intensity of care.      Physiatrist will provide 24-hour management of medical needs as      well as oversight of the therapy plan/treatment and provide      guidance as appropriate regarding interaction of the two.  24-hour      rehab nursing will assist in the management of the patient's      nutritional needs as well as skin care, bowel and bladder      continence, appropriate medication, and observation of the      patient's significant cardiovascular needs.  Physical therapy will      assess and treat the patient for pre-transferring and pre-gait      needs.  They will follow for range of motion exercises specifically      to the left lower extremity and preservation of resting range of      motion and positioning.  Also they will need to take into      consideration the patient's significant coronary artery history.      He will likely be short of breath with minimal activity but has      been used to a certain extent prior  to the stroke.  Occupational  therapy will assess and treat for upper extremities as well with      adaptive equipment and compensatory strategies as well as      cognitive/perceptual training.  We will try to incorporate the left      upper extremity as possible although considering the magnitude of      his weakness in the arm.  This may take weeks to months to      incorporate.  Speech language pathology will assess for cognitive      needs as well as dysarthria and swallowing.  Rehab case      manager/social worker will assess for psychosocial needs and      discharge planning.  Team conferences will be held weekly to      establish goals, assess progress and to determine barriers to      discharge.  The patient will receive at least 3 hours of therapy      per day at least 5 days per week.  Rehab goals are supervision to      min assist with basic mobility and transfers likely at the      wheelchair level.  The occupational therapy goals will be      supervision to min assist with more need assistance likely for      lower extremity care.  The estimated length of stay is 2-3 weeks.      Prognosis fair.  2. Blood pressure control:  Continue Norvasc, Lasix,      hydrochlorothiazide, Isordil, Lopressor, Benicar and Aldactone.  3. Coronary artery disease/congestive heart failure:  Continue      amiodarone as well as Lasix and hydrochlorothiazide/Paxil.  4. Hyperlipidemia:  Crestor.  5. Gout:  The patient seems to fairly well maintained on colchicine at      this point as his pain is decreased over the last few days.  6. Tobacco abuse:  Counseling as an outpatient.  7. Deep venous thrombosis prophylaxis with thigh-high TED hose.  At      this point, anticoagulation is contraindicated due to his      hemorrhage.      Ranelle Oyster, M.D.  Electronically Signed     ZTS/MEDQ  D:  05/23/2008  T:  05/24/2008  Job:  [id number]

## 2010-11-30 NOTE — Assessment & Plan Note (Signed)
[name] is back regarding his right thalamic hemorrhage.  We started on  baclofen for his left upper extremity spasticity last visit, he notes  some improvement in the tightness, particularly at the shoulder.  He is  walking with his AFO still.  Denies frank pain today.  He is sleeping  good.  Mood has been excellent.   REVIEW OF SYSTEMS:  Notable for the above.  Full 14-point review is in  the written health and history section of the chart.   SOCIAL HISTORY:  Unchanged.   PHYSICAL EXAMINATION:  VITAL SIGNS:  Blood pressure is 155/94, pulse 69,  respiratory rate 18, he is sating 97% on room air.  GENERAL:  The patient is pleasant, alert and oriented x3.  Affect is  bright and appropriate.  MUSCULOSKELETAL:  I think he has fine motor movement problems in the  upper and lower extremity in the left side.  Sensation is 1/2.  Strength  in the left upper extremity is 2/5, the hands are bit weaker at 1+/5.  Tone at the elbow is 1/4, at the fingers are 1+/4 to 2/4.  Shoulder is  minimally tight today.  He has trace-to-absent tone in left lower  extremity.  Left lower extremity strength 3+ to 4/5 still generally,  except at the ankle where it is 2/5.  The patient is generally stable  with his AFO and gait.  He uses a cane for support as well.  NEURO:  Speech is mildly dysarthric.  Cognitively, he is intact.  HEART:  Regular.  CHEST:  Clear.  ABDOMEN:  Soft, nontender.   ASSESSMENT:  1. Right thalamic hemorrhage with left hemisensory loss and left      spastic hemiparesis.  2. History of coronary artery disease and hypertension.  3. Gout.   PLAN:  1. We will increase his baclofen to 20 mg b.i.d. for 1 week, then      t.i.d. thereafter.  Consider Botox injections.  2. Blood pressure control per Dr. Algie Coffer.  3. I will see him back in about 6 weeks' time.      Ranelle Oyster, M.D.  Electronically Signed     ZTS/MedQ  D:  02/13/2009 10:20:07  T:  02/14/2009 02:37:05  Job #:   [id number]   cc:   [name], M.D.  Fax: [phone]

## 2010-12-03 NOTE — Cardiovascular Report (Signed)
. [facility]  Patient:    [name], [name]                     MRN: [id number] Proc. Date: 04/26/00 Attending:  Alvia Grove., M.D. CC:         Lemont Fillers. Fraser Din, M.D.   Cardiac Catheterization  INDICATIONS:  [name] is a middle-aged gentleman, with an acute onset of chest pain around 9 oclock in the evening.  He presented to the emergency room and was found to have ECG changes consistent with coronary ischemia.  He is brought to catheterization lab for urgent heart catheterization and possible angioplasty.  Dr. Fraser Din did the original diagnostic catheterization.  DESCRIPTION OF PROCEDURE:  The right femoral artery was easily cannulated using the modified Seldinger technique.  HEMODYNAMICS:  The left ventricular pressure is 150/35 with an aortic pressure of 150/104.  ANGIOGRAPHY:  The left main coronary artery is fairly ectatic.  There are moderate irregularities but no critical stenosis.  The left anterior descending artery has moderate disease in the proximal segment.  Just after the first diagonal vessel, there is tight 99% stenosis. The remainder of the LAD has mild to moderate irregularities but no critical stenosis.  The first diagonal vessel is a moderate sized vessel.  There is an ostial 8090 stenosis just at the origin of this vessel.  The remainder of the first diagonal vessel has mild irregularities but no critical stenosis.  There is a small to moderate sized ramus intermediate branch which has mild irregularities.  The left circumflex artery is a fairly large vessel.  There are moderate 30% stenosis in the proximal and distal segments of this vessel.  The right coronary artery is a very large vessel which is dominant.  There is a mid 20-30% stenosis.  The posterior descending artery and the posterolateral segment arteries are fairly normal.  There is an extensive network of collaterals which traverse up  through the septum toward the LAD.  This provides fair collateral flow up to the mid and distal LAD.  LEFT VENTRICULOGRAM:  The left ventriculogram was performed in a 30 RAO position.  It reveals mild to moderate hypokinesis of the anterior wall. There is contractility remaining in these walls.  PERCUTANEOUS TRANSLUMINAL CORONARY ANGIOPLASTY PROCEDURE:  The patient was given 20 mg of IV labetalol for moderate hypertension.  Heparin had been given in the emergency room.  A double bolus Integrilin drip was given.  The left anterior descending artery was easily wired using a traversed angioplasty wire.  A 3.0 CrossSail balloon was positioned down into the LAD and was inflated up to 6 atmospheres for 46 seconds followed by 10 atmospheres for 47 seconds.  This resulted in some improvement of the vessel lumen.  At this point, a 3.5 x 18 mm Penta stent was positioned across this narrowing.  It was deployed at 12 atmospheres for 49 seconds.  Resulted in a very nice left anterior descending artery lumen.  There was still some compromise of the diagonal vessel.  At this point the wire was moved up into the diagonal vessel.  The 3.0 x 15 mm CrossSail was positioned at the ostium, one inflation for 6 atmospheres for 47 seconds followed by an additional 8 atmospheres for 47 seconds was performed. This resulted in a marked improvement in the vessel lumen with minimal compromise of the side branch.  Followup angiography revealed a widely patent LAD with a widely patent first diagonal vessel.  There is still a 20-30% stenosis at the ostium of the first diagonal vessel but there is TIMI grade 3 flow.  COMPLICATIONS:  None.  CONCLUSIONS: 1. Successful PTCA and stenting of the left anterior descending artery with    successful PTCA of the first diagonal vessel. 2. Well preserved left ventricular systolic function. 3. The patient has moderate disease involving the right coronary artery and    the  left circumflex artery. DD:  09/19/00 TD:  09/20/00 Job: [id number]

## 2010-12-03 NOTE — Discharge Summary (Signed)
NAMERANBIR, [name]            ACCOUNT NO.:  [id number]   MEDICAL RECORD NO.:  [id number]          PATIENT TYPE:  INP   LOCATION:  3741                         FACILITY:  [facility]   PHYSICIAN:  [name], M.D.  DATE OF BIRTH:  04/12/1951   DATE OF ADMISSION:  05/01/2007  DATE OF DISCHARGE:  05/08/2007                               DISCHARGE SUMMARY   FINAL DIAGNOSIS:  1. Acute on chronic systolic heart failure.  2. Urinary tract infection.  3. Congestive heart failure.  4. Hypertension  5. Coronary atherosclerosis of native coronary vessel and old      myocardial infarction.  6. Gout.  7. Tobacco use disorder.  8. Status post angioplasty.   DISCHARGE MEDICATIONS:  1. Clonidine 0.1 mg three times daily.  2. Metoprolol 50 mg half twice daily.  3. Caduet 5/40 mg 1  daily.  4. Cordarone 200 mg daily.  5. Aspirin 325 mg one daily.  6. Plavix 75 mg one daily.  7. Aldactone 25 mg half daily.  8. Micardis 80 mg one daily.  9. Lasix 40 mg one on Monday, Wednesday, Friday.  10.Altace 5 mg twice daily.  11.Cipro 500 mg twice daily x4 days  12.Bidil half a tablet three times daily.  13.Colchicine 0.6 mg one daily.  14.Ibuprofen 28 one twice daily.  15.Vicodin 5/325 mg one twice daily.  16.KCl 10 mEq one daily.   DISCHARGE DIET:  Low-sodium heart-healthy diet.   DISCHARGE ACTIVITY:  The patient to increase activity slowly.   SPECIAL INSTRUCTIONS:  The patient to stop any activity that causes  chest pain, shortness of breath, dizziness, sweating or excessive  weakness.   FOLLOW-UP:  By Dr. Orpah Cobb in one month.   HOME HEALTH:  Home health to provide physical therapy and occupational  therapy on outpatient basis.   CONDITION ON DISCHARGE:  Improved.   HISTORY:  This is 60 year old black male presented with 1-day history of  weakness, shortness of breath along with some right-sided weakness and  mild fever.  The patient has cardiac risk factors of hypertension,  smoking, myocardial infarction x 2 and also has history of  cerebrovascular accident 2004.   PHYSICAL EXAM:  Temperature 100 degrees Fahrenheit, pulse 96,  respirations 16, blood pressure was 191/124.  IN GENERAL:  The patient is well-built, well-nourished black male in no  significant distress.  HEENT: The patient is normocephalic, atraumatic with frontal baldness,  has brown eyes, small pupils, conjunctivae pink.  Sclerae nonicteric.  NECK:  Positive JVD at 30 degrees angle, negative bruit.  LUNGS: Reveal bilateral basal crackles.  HEART: Normal S1, S2, positive S3.  ABDOMEN:  Soft.  EXTREMITIES:  Revealed 2+ edema.  SKIN:  Warm and dry.  NEUROLOGICALLY:  The patient is alert, oriented x 3 with cranial nerves  grossly intact.   LABORATORY DATA:  Revealed a normal hemoglobin/hematocrit, WBC count and  platelet count, normal PT/INR and PTT, normal electrolytes, BUN,  creatinine.  Sugar borderline at 115.  Subsequent sugar was down to 98.  Liver enzymes were near normal with a low albumin of 2.3.  CK-MB normal,  troponin-I  was slightly elevated at 0.18-0.23.  Urinalysis showed cloudy  urine with a moderate hemoglobin, bilirubin, and positive nitrite.  Blood culture negative x2.  EKG revealed sinus tachycardia with biatrial advancement and left axis  deviation and nonspecific ST-T changes.   Echocardiogram revealed mild left ventricle systolic dysfunction with  inferior posterior wall hypokinesia, moderate mitral valve  regurgitation, mildly dilated left atrium, and noninvasive vascular lab  with a carotid duplex study revealed no ICA stenosis and antegrade  vertebral artery flow.   HOSPITAL COURSE:  The patient was admitted to telemetry unit.  Myocardial infarction was ruled out.  He was given IV Lasix, IV  antibiotic with significant improvement in his condition gradually over  a four days.  IV Lasix activated gout-type inflammation for which  colchicine and ibuprofen was  started.  This helped the patient with his  ambulation and he was discharged home in satisfactory condition, with  follow-up by me in 2 weeks.      [name], M.D.  Electronically Signed     ASK/MEDQ  D:  06/22/2007  T:  06/22/2007  Job:  [id number]

## 2010-12-03 NOTE — Cardiovascular Report (Signed)
[place]  Patient:    [name], [name]                   MRN: [id number] Proc. Date: 04/26/00 Adm. Date:  [id number] Attending:  Meade Maw A CC:         Meade Maw, M.D.   Cardiac Catheterization  INDICATIONS:  [name] is a 60 year old gentleman who presented with an acute anterior wall myocardial infarction.  Dr. Fraser Din took him to catheterization where he was found to have significant disease involving his mid-LAD and proximal first diagonal vessel.  He was also found to have significant moderate disease involving the other vessels.  We were consulted for PTCA of the LAD and diagonal system.  The patient had been heparin in the emergency room.  His ACT was 308.  He was also on Integrilin.  DESCRIPTION OF PROCEDURE:  A 7 French Judkins left 4 catheter was used to engage the left main.  The left anterior descending artery was easily wired using a Traverse OM4 angioplasty wire.  A 3.0 x 15 mm Maverick was positioned across the LAD.  Two inflations were performed at the stenosis: 6 atmospheres for 46 seconds followed by 10 atmospheres for 47 seconds.  This resulted in significant improvement of the vessel lumen, but with still a residual 50% stenosis.  At this point a 3.5 x 18 mm Penta stent was positioned across the LAD.  The origin of the first diagonal vessel was included in the stented segment.  The stent was deployed at 12 atmospheres for a total of 49 seconds. This resulted in a very nice angiographic lumen of the left anterior descending artery.  The diagonal vessel which had a 90% stenosis originally remained open and basically unchanged.  We attempted to cross the side wall of the stent with the [facility] wire, but the tip of the wire had been slightly bent during the case.  This wire was removed and the stent struts were easily crossed using a short Patriot wire.  We reinserted the 3.0 Maverick balloon, but it would not  cross at the stent struts.  This balloon was removed and a 3.0 x 15 mm crossail balloon was used to cross the stent struts.  This balloon easily crossed out into the diagonal vessel.  We did two inflations of 6 atmospheres for 47 seconds followed by 8 atmospheres for 47 seconds.  This resulted in a nice angiographic lumen with perhaps a 30% residual stenosis in the diagonal.  The patient is stable and pain-free as he leaves the lab.  COMPLICATIONS:  None.  CONCLUSION: Successful PTCA and stenting of the left anterior descending with a reduction of 95% down to 0% in the left anterior descending.  The diagonal vessel was successfully angioplastied from 90% down to 30%. DD:  04/26/00 TD:  04/26/00 Job: [id number]
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
## 2010-12-03 NOTE — Assessment & Plan Note (Signed)
HISTORY OF PRESENT ILLNESS:  [name] is back regarding his right thalamic  hemorrhage.  He has done well with the baclofen for left upper  extremity, but he is still having some tightness there.  He does note  more active movement of his hand with the baclofen.  He walks with his  AFO and does fairly well with this.  He denies any pain.  His mood has  been good.  He states that his blood pressure has been up at times.   REVIEW OF SYSTEMS:  Notable for the above.  Full 14-point review is in  the written health and history section of the chart.   FAMILY HISTORY:  Notable for heart disease, high blood pressure.   PHYSICAL EXAMINATION:  VITAL SIGNS:  Blood pressure is 144/98, pulse is  82, respiratory rate 18, satting 98% on room air.  GENERAL:  The patient is pleasant, alert, and oriented x3.  Affect is  bright and appropriate.  Speech is still a little bit dysarthric, but  very intelligible.  Upper extremity strength is 2-2+/5 at the shoulder and elbow, it is 1-  2/5 at the hands.  Tone is 1-2/4 at the elbow and 1-2/4 at the fingers  and wrists.  He has good passive movement at the left shoulder.  Left  lower extremity has minimal tone.  Strength generally was 3+ to 4/5 at  the knee and hip.  Ankle movement is 1-2/5.  He is wearing his AFO and  does a nice job walking with good knee bend, weight shift, etc.  SKIN:  Intact.  HEART:  Regular.  CHEST:  Clear.  ABDOMEN:  Soft, nontender.   ASSESSMENT:  1. 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  2. History of gout.  3. Coronary artery disease and hypertension.   PLAN:  1. We will set the patient up for Botox injections in left biceps, FDS      and FDP muscles using 300 units of Botox.  2. The patient needs to follow up with Dr. Algie Coffer regarding his blood      pressure which remains out of control.  He is at extremely high-      risk for another hemorrhage.  3. I will see him back pending  scheduling the above.      Ranelle Oyster, M.D.  Electronically Signed     ZTS/MedQ  D:  03/27/2009 10:12:18  T:  03/28/2009 05:17:05  Job #:  [id number]   cc:   [name], M.D.  Fax: [phone]

## 2010-12-03 NOTE — Discharge Summary (Signed)
[name], [name]            ACCOUNT NO.:  [id number]   MEDICAL RECORD NO.:  [id number]          PATIENT TYPE:  INP   LOCATION:  6531                         FACILITY:  [facility]   PHYSICIAN:  [name], M.D.  DATE OF BIRTH:  11-29-1950   DATE OF ADMISSION:  08/02/2006  DATE OF DISCHARGE:  08/09/2006                               DISCHARGE SUMMARY   PRINCIPAL DIAGNOSES:  1. Three-vessel coronary arthrosclerosis.  2. Status post angioplasty of coronary vessels.  3. Congestive heart failure.  4. Hypertension.  5. Hyperlipidemia.  6. Tobacco use disorder.  7. Old myocardial infarction.  8. Old cerebrovascular accident.   DISCHARGE MEDICATIONS:  1. Aspirin 325 mg 1 daily.  2. Plavix 75 mg 1 daily.  3. Caduet 5/40 mg 1 daily.  4. Amiodarone 200 mg 1 daily.  5. Metoprolol 25 mg 1 twice daily.  6. Clonidine 0.1 mg 1 twice daily.  7. Lorazepam 0.5 mg 1 daily.  8. Micardis 40/12.5 mg 1 daily.  9. Isosorbide dinitrate 10 mg 3 times daily.  10.Xopenex 2 puffs 4 times daily as needed.   DISCHARGE DIET:  Low-fat, low-salt diet.   DISCHARGE ACTIVITY:  Patient to increase activity slowly.   WOUND CARE INSTRUCTIONS:  Patient will notify if right groin pain,  swelling or discharge.   SPECIAL INSTRUCTIONS:  Patient to heart failure booklet instructions.   FOLLOWUP:  By Dr. Orpah Cobb in 2 weeks.  Patient to call [phone] for  appointment.   HISTORY:  This 60 year old black male presented with left-sided chest  pain, along with exertional dyspnea.  The patient had a known history of  coronary artery disease and stent placement in left circumflex coronary  artery in September of 2007 and left anterior descending coronary artery  in 2001.   PHYSICAL EXAMINATION:  VITAL SIGNS:  Temperature 97.5.  Pulse 76.  Respirations 16.  Blood pressure 153/92.  Oxygen saturation 92% on room  air.  HEENT:  Patient is normocephalic, atraumatic with brown eyes equal,  round and reactive to  light.  Extraocular movement intact.  Conjunctivae  pink.  Sclerae are nonicteric.  NECK:  Positive JVD at 90 degree angle.  LUNGS:  Clear to auscultation bilaterally.  HEART:  Normal S1 and S2.  ABDOMEN:  Soft and nontender.  EXTREMITIES:  No edema, cyanosis or clubbing.  NEUROLOGICAL:  The patient is alert and oriented x3 and had grossly  intact cranial nerves.   LABORATORY DATA:  Normal hemoglobin, hematocrit, WBC count on the low  side, platelet count normal.  Electrolytes normal.  BUN 16, creatinine  borderline at 1.47.  CK 250, MB elevated at 6.4, troponin-I borderline  at 0.05.  B natriuretic peptid elevated at 1023.  EKG showed normal  sinus rhythm with left atrial enlargement and left ventricular  hypertrophy with inferior wall ischemia and left axis deviation.  Cardiac catheterization done by Dr. Orpah Cobb on August 03, 2006  showed a 3-vessel coronary artery disease with a patent stent in left  anterior descending coronary artery and left circumflex coronary artery.  There was significant disease in distal right coronary artery.  PTCA  angioplasty done by Dr. Sharyn Lull on August 08, 2006, decreased 90% RCA  lesion to 0% with a TIMI grade 3 flow using a 3.5 mm x 18 mm long CYPHER  drug-eluting stent.   HOSPITAL COURSE:  Patient was admitted to telemetry unit.  He received  IV Lasix as diuretics.  He had minimally elevated cardiac enzymes.  He  underwent left heart catheterization that showed a significant right  coronary artery distal stenosis of 80%-90%.  This was treated with drug-  eluting stent of 3.5 mm x 18 mm long CYPHER stent on August 08, 2006  with a good result.  Patient's kidney function remained stable and he  was discharged home in satisfactory condition with followup by me in 1-2  weeks.      [name], M.D.  Electronically Signed     ASK/MEDQ  D:  08/09/2006  T:  08/09/2006  Job:  [id number]

## 2010-12-03 NOTE — Discharge Summary (Signed)
[name], [name]            ACCOUNT NO.:  [id number]   MEDICAL RECORD NO.:  [id number]          PATIENT TYPE:  INP   LOCATION:  2015                         FACILITY:  [facility]   PHYSICIAN:  [name], M.D.  DATE OF BIRTH:  12-21-1950   DATE OF ADMISSION:  04/07/2006  DATE OF DISCHARGE:  04/12/2006                                 DISCHARGE SUMMARY   PRINCIPAL DIAGNOSES:  1. Acute myocardial infarction, subendocardial, initial episode.  2. Acute lung edema.  3. Atrial fibrillation.  4. Respiratory failure.  5. Congestive heart failure.  6. Chronic obstructive lung disease.  7. Shortness of breath.  8. Native vessel coronary artery atherosclerosis.  9. Percutaneous transluminal coronary angioplasty.  10.Hypertension.  11.Tobacco abuse disorder.  12.Alcohol dependence.   PRINCIPAL PROCEDURES:  Left heart catheterization, serial echocardiography  and left renal function study done by Dr. Sharyn Lull on April 07, 2006.   DISCHARGE MEDICATIONS:  1. Aspirin 325 mg 1 daily.  2. Metoprolol 25 mg 1 twice daily, may use half of 50 mg dose.  3. Robitussin DM 2 teaspoons 4 times a day.  4. Caduet 5/40 mg 1 in the evening.  5. Catapres 1 mg 3 times daily.  6. Plavix 75 mg 1 daily.  7. Xopenex 2 puffs 4 times daily.  8. Lorazepam 0.5 mg 1 daily.  9. Amiodarone 200 mg daily.  10.Prilosec 20 mg daily.  11.Cipro 250 mg twice daily for 5 days.  12.Micardis 80/25 half a tablet daily.  13.Potassium 10 mEq 1 daily.  14.Tylenol as directed and needed.  15.Nitroglycerin 0.4 mg 1 sublingually every 5 minutes times 3, as need      for chest pain.   DISCHARGE DIET:  Low-salt, low-fat diet.   ACTIVITY:  The patient to increase activity slowly.   WOUND CARE:  The patient to notify right groin pain, swelling or discharge.   FOLLOWUP:  1. By Dr. [name] in 1 to 2 weeks.  The family to call [phone]      for appointment.  2. Referred to cardiac rehabilitation for phase 2.   HISTORY:  This is a 60 year old black male presented to emergency room with  left-sided chest pain.  The patient was brought to the [facility]  intubated and then transferred here.  His past medical history is positive  for hypertension for 7 years, smoking 1 pack of cigarettes per day for 1  year and 1 to 1-1/2 packs of cigarettes per day for 40 years.  Had MI in  2001 with angioplasty and 1 stent placed, and has a family history of  coronary artery disease.   PHYSICAL EXAMINATION:  VITAL SIGNS:  Temperature 95, pulse 84, respirations  16, blood pressure 121/76, height 5 feet 9 inch, weight 189 pounds. O2  saturation 100% on respirator.  HEENT:  The patient is normocephalic, atraumatic.  Pupils small, color  brown.  NECK:  Positive JVD.  LUNGS:  Bilateral rhonchi.  HEART:  Normal S1 and S2 with S3 gallop.  ABDOMEN:  Soft and nontender.  EXTREMITIES:  No edema, cyanosis, clubbing.  RECTAL:  Showed stool  positive for occult blood.  SKIN: No rash.  CNS:  Cranial nerves appeared intact but the patient is intubated and  sedated, hence cannot carry out examination.   LABORATORY DATA:  Elevated hemoglobin 19, hematocrit 60, WBC count and  platelet count normal. Electrolytes normal.  BUN and creatinine borderline  at 15 and 1.7.  EKG sinus rhythm with lateral ischemic changes and old  anteroseptal wall MI.  B-type natruretic peptide level was elevated a 743.  TSH was 1.42, cardiac enzymes were markedly elevated at 1869 with an MB of  256 and troponin of 31.47. Subsequent cardiac enzymes peaked at 5396, CK  with MB of 763.6.  On April 08, 2006, second set of cardiac enzymes of  CK of 1286, MB 36.9 and troponin was down to 82. 07 from over 100  nanogram/mL.  Chest x-ray showed stable vascular congestion with bibasilar atelectasis.  EKG showed normal sinus rhythm with left atrial enlargement, left axis  deviation, left ventricular hypertrophy, and anterolateral ischemia.  2D  echocardiogram showed mild-to-moderate LV systolic dysfunction with an  ejection fraction of 40 to 45%.  Mild left ventricular hypertrophy,  moderately dilated left atrium, mildly reduced right ventricular systolic  function also.   Cardiac catheterization done by Dr. Rinaldo Cloud showed 100% left  circumflex artery occlusion and a mild-to-moderate LAD and right coronary  artery lesions with 3.5 x 33-mm long CYPHER drug-eluting stent.  The  patient's 100% lesion was reduced to 0% with excellent TIMI grade 3 flow.   HOSPITAL COURSE:  The patient was admitted to the coronary care unit after  undergoing emergent cardiac catheterization.  The patient had successful  angioplasty of the left circumflex artery, 100% occlusion.  Using 3.5 x 33-  mm long CYPHER drug-eluting stent, and subsequent care required assistance  of the critical care medicine for extubation.  The patient also received IV  Rocephin for possible infection.  On April 16, 2006, he was  successfully extubated. His condition continued to improve gradually over  the next 48 hours and he was stable enough for discharge on April 12, 2006, with followup by me in 2 weeks and primary care physician in 4 weeks.      [name], M.D.  Electronically Signed     ASK/MEDQ  D:  05/18/2006  T:  05/19/2006  Job:  [id number]

## 2010-12-03 NOTE — Discharge Summary (Signed)
[name]. [facility]  Patient:    [name], [name]                   MRN: [id number] Adm. Date:  [id number] Disc. Date: [id number] Attending:  Meade Robertson A Dictator:   [name], N.P.                           Discharge Summary  DATE OF BIRTH:  18-Oct-1950.  PROCEDURES: (April 26, 2000) PTCA/stent of the mid LAD; PTCA diagonal. Residual ostial 70-80% OM3; ___ 60-70%.  OM2, OM3, OM4, AVG with diffuse 40-50%.  There is right-to-left collateral flow to OM3 and OM4.  DISCHARGE DIAGNOSES/HOSPITAL SUMMARY:  #1 - ACUTE ANTEROSEPTAL INFARCT:  [name] is a 60 year old gentleman with two-week history intermittent chest discomfort then persistent discomfort evening of admission described as severe/crushing with associated nausea, vomiting, diaphoresis as well as shortness of breath.  He presented to [facility] Emergency Room where EKG was consistent with acute anteroseptal infarct. He was taken urgently for left heart catheterization.  He was started on IV nitrates and IV heparin as well as given 5 mg IV Lopressor in the emergency room.  Dr. Fraser Robertson performed the diagnostic coronary angiography which revealed anterior hypokinesis ejection fraction approximately 50%.  Subtotal occlusion of the mid LAD with significant disease of diagonal #1.  He also was found to have significant moderate disease involving the other vessels as detailed above.  He subsequently underwent a successful PTCA and stenting of the left anterior descending and PTCA of the diagonal.  He was initiated on and completed IV Integrilin bolus and drip.  Patient without complaint of chest pain the rest of hospital admission.  Evening of the first hospital day he did have a 31-beat run of V-tach which was asymptomatic.  This was in the setting of potassium of 3.5.  He was supplemented with oral potassium with follow-up serum potassium of 3.8 prior to discharge.  Patient ambulated  up and about with good tolerance of cardiac rehab phase 1; guidelines for activity progression at home were reviewed.  #2 - ISCHEMIC LEFT VENTRICULAR FUNCTION WITH ANTERIOR HYPOKINESIS AT TIME OF INITIAL HEART CATHETERIZATION; EJECTION FRACTION OF 50%:  Follow-up 2D echocardiogram April 26, 2000 revealed overall normal left ventricular systolic function with ejection fraction in the range of 55-65%.  No left ventricular wall motion abnormalities.  Moderately increased left ventricular wall thickness.  Left atrial size was normal; no valvular abnormality.  #3 - TOBACCO ABUSE:  Patient was strongly counseled for smoking cessation by cardiac rehab nurse, hospital staff, and physician.  He seems committed to smoking cessation.  #4 - HYPERTENSION:  Blood pressure remained somewhat elevated during course of admission.  When he walked with cardiac rehab, his blood pressure did go to as high as 160/102 despite presence of ACE inhibitor and beta blocker. Hydrochlorothiazide was added to his regimen at time of discharge.  #5 - DYSLIPIDEMIA:  Elevated HDL of 121; cholesterol of 177, triglycerides 149, and HDL of 26.  He was initiated on Zocor low dose during hospital admission.  He will have follow-up fasting statin profile in our clinic in about six weeks.  PLAN: 1. Patient was discharged home in stable and improved condition. 2. Discharge medications:    A. (NEW) Hydrochlorothiazide 2.5 mg p.o. q.d.    B. (NEW) Plavix 75 mg one p.o. q.d. for three weeks take with food.  C. (NEW) Nitroglycerin tab 0.4 mg one sublingual p.r.n. chest pain.    D. (NEW) Enteric-coated aspirin 325 mg once daily.    E. (NEW) Zocor 10 mg p.o. q.d.    F. (NEW) Ramipril 5 mg p.o. b.i.d.    G. (NEW) Toprol XL 100 mg p.o. q.d. 3. Activity: As outlined by cardiac rehab nurse. 4. Diet: Low fat, low cholesterol, low salt (instruction sheet will be    provided and reviewed with patient). 5. Wound care: May  shower. 6. Special instructions:    A. Stop smoking.    B. Patient to call our clinic if he develops large amount of swelling or       bruising in the groin area. 7. Follow-up:    A. Friday, May 12, 2000 at 10 a.m. with Dr. Hillary Robertson.    B. Wednesday, June 07, 2000 fasting statin profile at 9:45 a.m.  PAST MEDICAL HISTORY  Hypertension.  Comment: Patient is uninsured.  The majority of his first months medication were provided through [facility] (up to $200).  LABORATORY TESTS AND DATA:  Serial cardiac enzymes: First CK of 225 with MB fraction of 6.9; second CK of 521 with MB fraction 81.5; third CK 417 with MB fraction of 59.6.  First troponin I of 0.10, second troponin I of 10.27. Statin profile revealed a cholesterol of 177, triglycerides of 149, HDL of 26, LDL of 121.  Chemistry profile revealed sodium 140, potassium of 3.5; supplemented. Potassium of 3.8 at time of discharge.  Chloride of 100, CO2 of 31, glucose of 127; 97 at time of discharge.  BUN of 14, creatinine of 1.2; 10 and 1.0 at time of discharge.  Calcium 9.8.  LFTs: Total protein 8.4, albumin of 3.2, AST elevated at 104, ALT elevated at 91, ALP 87, Total bili was 0.8, magnesium 1.7.  WBC of 5.5, hemoglobin 20.5, hematocrit 56.7, platelets of 202.  Admission EKG revealed NSR with a 2 mm ST elevation V1-V6 with reciprocal ST changes in the inferolateral lead suggestive of a central infarct.  DD: 04/28/00 TD:  04/30/00 Job: [id number]

## 2010-12-03 NOTE — Cardiovascular Report (Signed)
NAME:  [name], [name]            ACCOUNT NO.:  [id number]   MEDICAL RECORD NO.:  [id number]          PATIENT TYPE:  INP   LOCATION:  3730                         FACILITY:  [facility]   PHYSICIAN:  [name], M.D.  DATE OF BIRTH:  26-Jun-1951   DATE OF PROCEDURE:  08/03/2006  DATE OF DISCHARGE:                            CARDIAC CATHETERIZATION   PROCEDURE:  1. Left heart catheterization.  2. Selective coronary angiography.   INDICATIONS:  This 60 year old black male had chest pain, leg edema,  shortness of breath and abnormal cardiac enzymes, with a known history  of coronary artery disease, hypertension, smoking and myocardial  infarction.   APPROACH:  Right femoral artery using 5-French sheath and catheters.   COMPLICATIONS:  None.   HEMODYNAMIC DATA:  The aortic pressure was 148/97 and left ventricular  pressure was 148/15.   CORONARY ANATOMY:  The left main coronary artery was short, but  unremarkable.   Left anterior descending coronary artery:  The left anterior descending  coronary artery showed proximal luminal irregularities and proximal to  mid vessel stent patent, then moderate diffuse disease of the distal  segment near the apex of the heart.  The diagonal #1 had ostial 50%  stenosis and mid to distal severe disease.  Diagonal #2 had mid-vessel  diffuse disease.  Diagonal #3 was a larger vessel with a mid-vessel 80%  lesion.  Diagonal #4 was very small with diffuse disease.   Left circumflex coronary artery:  The left circumflex coronary artery  was patent with proximal to mid segment stent also patent.  The distal  vessel had diffuse disease followed by 100% occlusion at the posterior  obtuse marginal branch 4 origin.  The obtuse marginal branches #1, #2  and #3 were very small vessel with diffuse narrowing.  Obtuse marginal  branch #4 was patent with moderate diffuse disease.   Right coronary artery:  The right coronary artery was dominant.  It was  a  larger vessel with mild proximal disease, mid vessel 20% to 40%  lesions and a distal 80% to 90% lesion.  The posterolateral branch had  severe mid to distal narrowing and posterior descending coronary artery  had ostial 30% stenosis and then distal diffuse disease.  The mid  segment was unremarkable.   Left ventriculogram was not done and 45 mL of dye were used.   IMPRESSION:  1. Three-vessel coronary artery disease.  2. Patent stent in left anterior descending and left circumflex      coronary artery.   RECOMMENDATIONS:  This patient will undergo PTCA stent placement in  distal right coronary artery in a few days.  If his blood work remains  stable, he will undergo 2-D echocardiogram for left ventricular systolic  function evaluation.      [name], M.D.  Electronically Signed     ASK/MEDQ  D:  08/03/2006  T:  08/03/2006  Job:  [id number]

## 2010-12-03 NOTE — H&P (Signed)
[place]. [facility]  Patient:    [name], [name]                   MRN: [id number] Adm. Date:  [id number] Disc. Date: [id number] Attending:  Meade Maw A                         History and Physical  REASON FOR ADMISSION:  Chest pain.  HISTORY:  [name] is a 59 year old African-American male who has been having intermittent episodes of chest pain for approximately two weeks.  The pain started at 9 p.m. tonight, was severe, and persisted, described as in the left chest region as a crushing sensation.  There has been associated nausea and vomiting, diaphoresis, and shortness of breath.  The pain occurred while at rest, has had no relieving factor.  CORONARY RISK FACTORS:  Include male sex, hypertension, tobacco abuse, no fracture, cholesterol status is unknown.  PAST MEDICAL HISTORY:  Significant for hypertension.  PAST SURGICAL HISTORY:  None.  REVIEW OF SYSTEMS:  There has been no fevers, no chills, no cough.  He has had pedal edema.  This has been intermittent.  No presyncope, no syncope.  No palpitations, no tachyarrhythmias.  He has not noted any blood in his stool. He has had increasing episodes of shortness of breath.  SOCIAL HISTORY:  The patient is married.  His wife is a Designer, jewellery. Smokes one pack per day.  Significant alcohol intake.  Works as a Naval architect.  PHYSICAL EXAMINATION:  GENERAL:  Reveals a middle-aged male in no acute distress, but complaining of severe left chest pain.  VITAL SIGNS:  Blood pressure 195/135, heart rate 90, respiratory rate 24-26. He is afebrile.  HEENT:  No evidence of xanthelasma.  LUNGS:  Breath sounds are equal and clear to auscultation.  No use of accessory muscles.  CARDIOVASCULAR:  Reveals normal S1, normal S2, regular rate and rhythm.  No rubs, murmurs, or gallops are noted.  PMI is within normal limits.  ABDOMEN:  Soft and nontender, normal bowel sounds.  No unusual bruits  or pulsation.  No hepatomegaly noted.  EXTREMITIES:  Reveal trace pedal edema and his distal pulses are equal and palpable.  No clubbing, cyanosis, or edema are noted.  SKIN:  Warm and dry.  NEUROLOGIC:  Alert and oriented.  Orlene Erm is appropriate.  Motor is 5/5 throughout.  LABORATORY DATA:  ECG reveals a normal sinus rhythm.  There is a 2 mm ST elevation in V1-V3, with reciprocal ST changes in the inferolateral leads, suggestive of a septal infarct.  Cardiac enzymes are currently unavailable.  His sodium is 134, creatinine 1.2.  IMPRESSION: 1. Acute anteroseptal infarct.  Risks, benefits, options were discussed with    the patient.  The patient will be taken emergently for left heart    catheterization and intervention as necessary. 2. Hypertension.  The patient will be treated with Lopressor and an ACE    inhibitor.  Patient will be started on heparin and Integrilin drip.  The    ER records were reviewed.  The patient has been previously given aspirin    and nitroglycerin has been started. 3. Tobacco abuse.  Smoking cessation was discussed with the patient. 4. Possible ETOH withdrawal.  The patient will be initiated on Librium as    indicated.  Further recommendations will be pending the outcome of the left heart catheterization. DD:  04/26/00 TD:  04/26/00 Job: [id number]

## 2010-12-03 NOTE — Cardiovascular Report (Signed)
NAME:  [name], [name]            ACCOUNT NO.:  [id number]   MEDICAL RECORD NO.:  [id number]          PATIENT TYPE:  INP   LOCATION:  3730                         FACILITY:  [facility]   PHYSICIAN:  Mohan N. Sharyn Lull, M.D. DATE OF BIRTH:  1951-07-14   DATE OF PROCEDURE:  08/08/2006  DATE OF DISCHARGE:                            CARDIAC CATHETERIZATION   PROCEDURES:  1. Left cardiac cath with selective right coronary angiography via      right groin using Judkins technique.  2. PTCA to distal RCA using 2.5 x 8 mm long Voyager balloon.  3. Successful deployment of 3.5 x 18 mm long Cypher drug-eluting stent      in distal RCA.  4. Successful post dilatation of Cypher drug-eluting stent using 3.5 x      8 mm long PowerSail balloon.   INDICATIONS FOR PROCEDURE:  Mr. Laser is a 59 year old black male  with past medical history significant for MI times 2.  Had acute non-Q-  wave MI and September 2007.  Was noted to have 100% proximal left  circumflex requiring 2 Cypher drug-eluting stents, history of PTCA and  stenting to LAD in the past, hypertension, history of tobacco abuse.  Was admitted by Dr. Algie Coffer on 01/16 because of left-sided chest pain  and exertional dyspnea. An MI was ruled out by serial enzymes and EKGs.  The patient subsequently underwent left cardiac cath by Dr. Algie Coffer on  01/17 which showed patent left circumflex stent and mild disease in LAD  and moderate disease in small diagonals.  Distal RCA had 80-85% stenosis  just prior to bifurcation with PDA.  The patient is referred for PCI to  RCA.   PROCEDURE:  After obtaining informed consent, the patient was brought to  the cath lab and was placed on the fluoroscopy table.  The right groin  was prepped and draped in usual fashion.  Two percent Xylocaine was used  for local anesthesia in the right groin.  With the help of thin-walled  needle, a 6 French arterial sheath was placed.  The sheath was aspirated  and flushed.   Next, a 6 French right JR-4 with side hole guiding  catheter was advanced over the wire under fluoroscopic guidance up to  the ascending aorta.  The wire was pulled out.  The catheter was  aspirated and connected to the manifold.  The catheter was further  advanced and engaged into right coronary ostium. A single view of right  coronary artery was obtained.   FINDINGS:  The RCA showed 15-20% proximal stenosis and 30-40% mid  stenosis and 85% distal stenosis just prior to bifurcation with PDA.   INTERVENTIONAL PROCEDURES:  Successful PTCA to distal RCA was done using  2.5 x 8 mm long Voyager balloon for predilatation and then 3.5 x 18 mm  long Cypher drug-eluting stent was deployed in distal RCA at 11  atmospheres pressure. The stent was post dilated using 3.5 x 8 mm long  PowerSail balloon going up to 15  atmospheres pressure achieving diameter of 3.64 mm without evidence of  dissection or distal embolization.  The patient received Angiomax  and  300 mg of Plavix during the procedure.  The patient tolerated the  procedure well.  There were no complications.  The patient was  transferred to recovery room in stable condition.           ______________________________  Eduardo Osier Sharyn Lull, M.D.     MNH/MEDQ  D:  08/08/2006  T:  08/08/2006  Job:  [id number]   cc:   Cath Lab  [name], M.D.

## 2010-12-03 NOTE — Cardiovascular Report (Signed)
[place]. [facility]  Patient:    [name], [name]                   MRN: [id number] Adm. Date:  [id number] Attending:  Meade Maw A                        Cardiac Catheterization  PROCEDURE:  Left heart catheterization, coronary angiography, single plane ventriculogram.  INDICATIONS:  Acute anterior myocardial infarction.  DESCRIPTION OF PROCEDURE:  Risks, benefits, and options were explained to the patient.  The patient was then brought to the Cath lab on emergent basis.  The right groin was prepped and draped in the usual sterile fashion.  Local anesthesia was achieved using 1% Xylocaine.  A 7 French hemostasis sheath was placed into the right femoral artery using modified Seldinger technique. Selective coronary angiography was performed using a JL4 and JR4 Judkins catheter.  All catheter exchanges were made over a guide wire.  The hemostasis sheath was flushed following each engagement.  Multiple views were obtained. Single plane ventriculogram was performed in the RAO position using a 6 French pigtail curved catheter.  After obtaining the films, the left anterior descending was noted to be subtotally occluded.  Dr. Elease Hashimoto was emergently consulted and proceeded with angioplasty of the left anterior descending.  FINDINGS:  The aortic pressure was 150/104, LV pressure 150/35.  Single plane ventriculogram revealed anterior hypokinesis with an ejection fraction of 50%.  There was no mitral reguritation noted.  CORONARY ANGIOGRAPHY:  The left main coronary artery bifurcated into the left anterior descending and circumflex vessel.  There was no significant disease in the left main coronary artery.  Left anterior descending.  Left anterior descending gave rise to a large D1, a large D2, and an apical recurrent branch.  There was a subtotal occlusion of the LAD.  There was an 80% ostial lesion noted in the first diagonal.  Circumflex vessel.  The  circumflex vessel gave rise to a small high OM1, OM2, OM3, and an OM4.  The fourth obtuse marginal was a large trifurcating branch. There was ostial disease noted in the OM2 and OM3.  These branches were probably totally occluded and appeared to have right to left collaterals from the right coronary artery.  The right coronary artery was a large dominant artery that gave rise to a moderate size RV marginal 1, a small RV marginal 2, a large PDA, and large PL branch.  There was a 60 to 70% lesion noted in the second RV marginal.  IMPRESSION: 1. Acute anterior myocardial infarction. 2. Subtotal occlusion of the proximal left anterior descending. 3. Critical disease involving the first diagonal. 4. Probable total occlusion of the obtuse marginal which is chronic and    receiving collaterals from the right coronary artery.  RECOMMENDATIONS:  Dr. Elease Hashimoto was consulted and will proceed with emergent angioplasty of the LAD.  The patient will continue with the heparin, aspirin, and Integrilin drip.  He will be initiated on Altace and Lopressor for his hypertension.  Alcohol abuse.  Librium protocol has been initiated. DD:  04/26/00 TD:  04/26/00 Job: [id number]

## 2010-12-03 NOTE — Cardiovascular Report (Signed)
NAME:  [name], [name]            ACCOUNT NO.:  [id number]   MEDICAL RECORD NO.:  [id number]          PATIENT TYPE:  INP   LOCATION:  2015                         FACILITY:  [facility]   PHYSICIAN:  Eduardo Osier. Sharyn Lull, M.D. DATE OF BIRTH:  April 28, 1951   DATE OF PROCEDURE:  04/07/2006  DATE OF DISCHARGE:                              CARDIAC CATHETERIZATION   PROCEDURE:  Left cardiac catheterization with selective left and right  coronary angiography, left ventriculography via right groin using Judkins  technique.   INDICATION FOR THE PROCEDURE:  [name] is a 60 year old black male  with past medical history significant for coronary artery disease, history  of PTCA stenting to LAD in the past, hypertension, long history of tobacco  abuse, history of MI in the past, who was transferred from [facility]  from [place] intubated to [facility].  As per wife, the patient  woke up earlier on 4 a.m. complaining of severe left-sided chest pain  associated with palpitation and shortness of breath.  The patient was noted  to be in acute pulmonary edema requiring intubation at [facility] in  [place] and was noted to be in atrial fibrillation with rapid ventricular  response with ST-T wave changes in inferolateral leads.  The patient was  noted to have elevated CPK-MB and troponin I suggestive of acute non-Q-wave  MI.  Due to typical anginal chest pain, elevated enzymes, multiple risk  factors, discussed with wife briefly regarding emergency left  catheterization, possible PTCA stenting, its risks and benefits, and  consented for the procedure.   PROCEDURE:  After obtaining the informed consent the patient was brought to  the catheterization laboratory and was placed on the fluoroscopy table.  The  right groin was prepped and draped in usual fashion.  Xylocaine 2% was used  for local anesthesia in the right groin.  With the help of thin-wall needle  6-French arterial and  venous sheaths were placed.  Both the sheaths  aspirated and flushed.  Next, 6-French left Judkins catheter was advanced  over the wire under fluoroscopic guidance up to the ascending aorta.  Wire  was pulled out, the catheter was aspirated and connected to the manifold.  Catheter was further advanced and engaged into left coronary ostium.  Multiple views of the left system were taken.  Next, the catheter was  disengaged and was pulled out over the wire and was replaced with a 6-French  right Judkins catheter which was advanced over the wire under fluoroscopic  guidance up to the ascending aorta.  Wire was pulled out, the catheter was  aspirated and connected to the manifold.  Catheter was further advanced and  engaged into right coronary ostium.  Multiple views of the right system were  taken.  Next, the catheter was disengaged and was pulled out over the wire  and was replaced with 6-French pigtail catheter at the end of the procedure,  which was advanced over the wire under fluoroscopic guidance up to the  ascending aorta.  Catheter was further advanced across the aortic valve into  LV.  LV pressures were recorded.  Next, left  ventriculograph was done in 30-  degree RAO position.  Post angiographic pressures were recorded from the LV  and then pullback pressures were recorded from the aorta.  There was no  gradient across the aortic valve.  Next, the pigtail catheter was pulled out  over the wire.  Sheaths aspirated and flushed.   FINDINGS:  LV showed inferobasal and mid wall severe hypokinesia.  There was  2+ MR, EF of about 40-45%.  Left main was patent.  LAD was patent at prior  PTCA stented site and has 20-25% mid stenosis distal to the stent.  Diagonal  one has proximal and mid 70-75% stenosis which is small vessel.  Diagonal  two is very, very small, diffusely diseased.  Diagonal three has 20-30%  ostial and 70-75% mid stenosis.  Ramus was very small which was diffusely  diseased.   Left circumflex was 100% occluded proximally.  RCA has 30-40% mid  sequential stenosis and 40-50% distal sequential stenosis.  PDA is patent.  PLV branch has mild disease.  Interventional procedure was successful.  PTCA  to proximal left circumflex was done using 2.5 x 12-mm long Voyager balloon  for predilatation, and then 3.5 x 33-mm long Cypher drug-eluting stent was  deployed in proximal and mid left circumflex at 15 atmospheric pressures.  Stent was postdilated using 3.75 x 15-mm long Powersail balloon.  The lesion  dilated from 100% to 0% residual with excellent TIMI grade 3 distal flow  without evidence of dissection or distal embolization.  Distally, left  circumflex and OM-3 has 40-50% diffuse stenosis.  The patient received  weight-based heparin and Integrilin during the procedure and will receive  600 of Plavix in the recovery room via NG tube.  The patient tolerated  procedure well.  There are no complications.  The patient was transferred to  recovery room in stable condition.           ______________________________  Eduardo Osier Sharyn Lull, M.D.     MNH/MEDQ  D:  04/07/2006  T:  04/10/2006  Job:  [id number]   cc:   [name], M.D.  Cath Lab  Dr. Cindee Lame
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
## 2011-04-19 LAB — URINALYSIS, ROUTINE W REFLEX MICROSCOPIC
Bilirubin Urine: NEGATIVE
Nitrite: NEGATIVE
Protein, ur: 100 — AB
Specific Gravity, Urine: 1.015
Urobilinogen, UA: 2 — ABNORMAL HIGH
Urobilinogen, UA: 2 — ABNORMAL HIGH

## 2011-04-19 LAB — BASIC METABOLIC PANEL
BUN: 14
BUN: 32 — ABNORMAL HIGH
BUN: 33 — ABNORMAL HIGH
BUN: 37 — ABNORMAL HIGH
BUN: 47 — ABNORMAL HIGH
BUN: 51 — ABNORMAL HIGH
BUN: 57 — ABNORMAL HIGH
CO2: 22
CO2: 24
CO2: 25
CO2: 28
Calcium: 9
Calcium: 9
Calcium: 9.1
Calcium: 9.1
Calcium: 9.1
Calcium: 9.2
Chloride: 104
Creatinine, Ser: 1.93 — ABNORMAL HIGH
Creatinine, Ser: 1.96 — ABNORMAL HIGH
Creatinine, Ser: 2.01 — ABNORMAL HIGH
Creatinine, Ser: 2.35 — ABNORMAL HIGH
GFR calc Af Amer: 41 — ABNORMAL LOW
GFR calc Af Amer: 43 — ABNORMAL LOW
GFR calc non Af Amer: 28 — ABNORMAL LOW
GFR calc non Af Amer: 29 — ABNORMAL LOW
GFR calc non Af Amer: 34 — ABNORMAL LOW
GFR calc non Af Amer: 35 — ABNORMAL LOW
GFR calc non Af Amer: 35 — ABNORMAL LOW
GFR calc non Af Amer: 45 — ABNORMAL LOW
Glucose, Bld: 137 — ABNORMAL HIGH
Glucose, Bld: 89
Glucose, Bld: 95
Glucose, Bld: 96
Glucose, Bld: 98
Potassium: 3.4 — ABNORMAL LOW
Potassium: 3.8
Potassium: 4.2
Sodium: 135
Sodium: 137
Sodium: 138

## 2011-04-19 LAB — CBC
HCT: 47.4
HCT: 47.7
HCT: 51.7
HCT: 52.8 — ABNORMAL HIGH
Hemoglobin: 15.2
Hemoglobin: 15.3
Hemoglobin: 16.2
Hemoglobin: 16.6
MCHC: 31.4
MCHC: 32
MCV: 80.1
MCV: 80.7
Platelets: 154
Platelets: 185
Platelets: 287
RBC: 6.45 — ABNORMAL HIGH
RDW: 17.2 — ABNORMAL HIGH
RDW: 17.8 — ABNORMAL HIGH
RDW: 17.8 — ABNORMAL HIGH
WBC: 4.9

## 2011-04-19 LAB — GLUCOSE, CAPILLARY
Glucose-Capillary: 108 — ABNORMAL HIGH
Glucose-Capillary: 112 — ABNORMAL HIGH
Glucose-Capillary: 113 — ABNORMAL HIGH
Glucose-Capillary: 121 — ABNORMAL HIGH
Glucose-Capillary: 123 — ABNORMAL HIGH
Glucose-Capillary: 124 — ABNORMAL HIGH
Glucose-Capillary: 132 — ABNORMAL HIGH

## 2011-04-19 LAB — COMPREHENSIVE METABOLIC PANEL
ALT: 54 — ABNORMAL HIGH
Albumin: 2.8 — ABNORMAL LOW
Albumin: 2.9 — ABNORMAL LOW
Alkaline Phosphatase: 111
Alkaline Phosphatase: 111
BUN: 15
BUN: 45 — ABNORMAL HIGH
Chloride: 105
Chloride: 110
Creatinine, Ser: 1.29
Glucose, Bld: 114 — ABNORMAL HIGH
Glucose, Bld: 82
Potassium: 3.4 — ABNORMAL LOW
Potassium: 4.2
Sodium: 137
Total Bilirubin: 0.7
Total Bilirubin: 0.9

## 2011-04-19 LAB — DIFFERENTIAL
Basophils Absolute: 0
Basophils Relative: 0
Eosinophils Absolute: 0.1
Monocytes Absolute: 0.6
Neutro Abs: 2.8
Neutrophils Relative %: 58

## 2011-04-19 LAB — PROTIME-INR
INR: 1.2
Prothrombin Time: 15.8 — ABNORMAL HIGH

## 2011-04-19 LAB — CLOSTRIDIUM DIFFICILE EIA: C difficile Toxins A+B, EIA: NEGATIVE

## 2011-04-19 LAB — URINE MICROSCOPIC-ADD ON

## 2011-04-19 LAB — URINE CULTURE: Culture: NO GROWTH

## 2011-04-19 LAB — CK TOTAL AND CKMB (NOT AT ARMC)
CK, MB: 3.6
Relative Index: 3.3 — ABNORMAL HIGH

## 2011-04-19 LAB — CARDIAC PANEL(CRET KIN+CKTOT+MB+TROPI)
CK, MB: 3.3
Total CK: 353 — ABNORMAL HIGH

## 2011-04-19 LAB — TROPONIN I: Troponin I: 0.05

## 2011-04-19 LAB — CULTURE, BLOOD (ROUTINE X 2): Culture: NO GROWTH

## 2011-04-19 LAB — APTT: aPTT: 34

## 2011-04-19 LAB — LIPID PANEL: Triglycerides: 52

## 2011-04-19 LAB — B-NATRIURETIC PEPTIDE (CONVERTED LAB): Pro B Natriuretic peptide (BNP): 230 — ABNORMAL HIGH

## 2011-04-27 LAB — CULTURE, BLOOD (ROUTINE X 2)
Culture: NO GROWTH
Culture: NO GROWTH

## 2011-04-27 LAB — URINE MICROSCOPIC-ADD ON

## 2011-04-27 LAB — URINALYSIS, ROUTINE W REFLEX MICROSCOPIC
Glucose, UA: NEGATIVE
Specific Gravity, Urine: 1.027
Urobilinogen, UA: 4 — ABNORMAL HIGH
pH: 6

## 2011-04-27 LAB — COMPREHENSIVE METABOLIC PANEL
AST: 30
AST: 40 — ABNORMAL HIGH
Albumin: 2 — ABNORMAL LOW
Albumin: 2.3 — ABNORMAL LOW
BUN: 12
Calcium: 8.4
Calcium: 8.7
Chloride: 104
Creatinine, Ser: 1.26
Creatinine, Ser: 1.68 — ABNORMAL HIGH
GFR calc Af Amer: 51 — ABNORMAL LOW
GFR calc Af Amer: >60
Sodium: 137
Total Bilirubin: 1.3 — ABNORMAL HIGH
Total Protein: 7.1
Total Protein: 7.3

## 2011-04-27 LAB — CARDIAC PANEL(CRET KIN+CKTOT+MB+TROPI)
CK, MB: 1.2
Relative Index: INVALID
Total CK: 83
Total CK: 96

## 2011-04-27 LAB — CBC
MCHC: 32.7
RBC: 5.13
RDW: 18.2 — ABNORMAL HIGH

## 2011-04-27 LAB — BASIC METABOLIC PANEL
CO2: 28
Calcium: 8.1 — ABNORMAL LOW
Calcium: 8.2 — ABNORMAL LOW
GFR calc Af Amer: 50 — ABNORMAL LOW
GFR calc non Af Amer: 41 — ABNORMAL LOW
GFR calc non Af Amer: 44 — ABNORMAL LOW
Glucose, Bld: 122 — ABNORMAL HIGH
Potassium: 3.5
Potassium: 4
Sodium: 135
Sodium: 136

## 2011-04-27 LAB — URINE CULTURE: Colony Count: 60000

## 2011-04-27 LAB — POCT CARDIAC MARKERS
CKMB, poc: 2.7
CKMB, poc: 3.8
Operator id: 277751
Troponin i, poc: 0.18 — ABNORMAL HIGH

## 2011-04-27 LAB — TROPONIN I: Troponin I: 0.23 — ABNORMAL HIGH

## 2011-04-27 LAB — PROTIME-INR: INR: 1.2

## 2011-04-27 LAB — B-NATRIURETIC PEPTIDE (CONVERTED LAB)
Pro B Natriuretic peptide (BNP): 1393 — ABNORMAL HIGH
Pro B Natriuretic peptide (BNP): 467 — ABNORMAL HIGH

## 2011-04-27 LAB — URIC ACID: Uric Acid, Serum: 7.9 — ABNORMAL HIGH

## 2011-04-27 LAB — APTT: aPTT: 34

## 2011-04-28 LAB — I-STAT 8, (EC8 V) (CONVERTED LAB)
BUN: 12
Bicarbonate: 24.8 — ABNORMAL HIGH
Glucose, Bld: 117 — ABNORMAL HIGH
Sodium: 138

## 2011-04-28 LAB — DIFFERENTIAL
Basophils Absolute: 0
Eosinophils Relative: 0
Lymphocytes Relative: 16
Monocytes Absolute: 0.7

## 2011-04-28 LAB — BASIC METABOLIC PANEL
CO2: 24
Chloride: 103
GFR calc non Af Amer: 57 — ABNORMAL LOW
Glucose, Bld: 115 — ABNORMAL HIGH
Potassium: 3.8
Sodium: 135

## 2011-04-28 LAB — CBC
HCT: 48.6
Hemoglobin: 15.6
MCHC: 32.1
MCV: 80.2
RDW: 18.3 — ABNORMAL HIGH

## 2016-05-06 ENCOUNTER — Inpatient Hospital Stay (HOSPITAL_COMMUNITY): Payer: Medicare HMO

## 2016-05-06 ENCOUNTER — Encounter (HOSPITAL_COMMUNITY): Payer: Self-pay | Admitting: Pulmonary Disease

## 2016-05-06 ENCOUNTER — Inpatient Hospital Stay (HOSPITAL_COMMUNITY)
Admission: AD | Admit: 2016-05-06 | Discharge: 2016-05-11 | DRG: 064 | Disposition: A | Discharge status: Home / Self Care | Payer: Medicare HMO | Source: Other Acute Inpatient Hospital | Attending: Internal Medicine | Admitting: Internal Medicine

## 2016-05-06 DIAGNOSIS — E43 Unspecified severe protein-calorie malnutrition: Secondary | ICD-10-CM | POA: Diagnosis present

## 2016-05-06 DIAGNOSIS — G8194 Hemiplegia, unspecified affecting left nondominant side: Secondary | ICD-10-CM | POA: Diagnosis not present

## 2016-05-06 DIAGNOSIS — I130 Hypertensive heart and chronic kidney disease with heart failure and stage 1 through stage 4 chronic kidney disease, or unspecified chronic kidney disease: Secondary | ICD-10-CM | POA: Diagnosis present

## 2016-05-06 DIAGNOSIS — R531 Weakness: Secondary | ICD-10-CM | POA: Diagnosis present

## 2016-05-06 DIAGNOSIS — I63311 Cerebral infarction due to thrombosis of right middle cerebral artery: Secondary | ICD-10-CM | POA: Diagnosis present

## 2016-05-06 DIAGNOSIS — J15211 Pneumonia due to Methicillin susceptible Staphylococcus aureus: Secondary | ICD-10-CM | POA: Diagnosis not present

## 2016-05-06 DIAGNOSIS — Z66 Do not resuscitate: Secondary | ICD-10-CM | POA: Diagnosis present

## 2016-05-06 DIAGNOSIS — E785 Hyperlipidemia, unspecified: Secondary | ICD-10-CM | POA: Diagnosis present

## 2016-05-06 DIAGNOSIS — E876 Hypokalemia: Secondary | ICD-10-CM | POA: Diagnosis present

## 2016-05-06 DIAGNOSIS — M109 Gout, unspecified: Secondary | ICD-10-CM | POA: Diagnosis present

## 2016-05-06 DIAGNOSIS — I2510 Atherosclerotic heart disease of native coronary artery without angina pectoris: Secondary | ICD-10-CM | POA: Diagnosis present

## 2016-05-06 DIAGNOSIS — I6523 Occlusion and stenosis of bilateral carotid arteries: Secondary | ICD-10-CM | POA: Diagnosis present

## 2016-05-06 DIAGNOSIS — Z7982 Long term (current) use of aspirin: Secondary | ICD-10-CM

## 2016-05-06 DIAGNOSIS — I619 Nontraumatic intracerebral hemorrhage, unspecified: Secondary | ICD-10-CM | POA: Diagnosis not present

## 2016-05-06 DIAGNOSIS — Z8673 Personal history of transient ischemic attack (TIA), and cerebral infarction without residual deficits: Secondary | ICD-10-CM

## 2016-05-06 DIAGNOSIS — Z978 Presence of other specified devices: Secondary | ICD-10-CM | POA: Diagnosis not present

## 2016-05-06 DIAGNOSIS — I610 Nontraumatic intracerebral hemorrhage in hemisphere, subcortical: Secondary | ICD-10-CM | POA: Diagnosis not present

## 2016-05-06 DIAGNOSIS — N189 Chronic kidney disease, unspecified: Secondary | ICD-10-CM | POA: Diagnosis present

## 2016-05-06 DIAGNOSIS — J9601 Acute respiratory failure with hypoxia: Secondary | ICD-10-CM | POA: Diagnosis present

## 2016-05-06 DIAGNOSIS — Z515 Encounter for palliative care: Secondary | ICD-10-CM

## 2016-05-06 DIAGNOSIS — I63411 Cerebral infarction due to embolism of right middle cerebral artery: Secondary | ICD-10-CM | POA: Diagnosis not present

## 2016-05-06 DIAGNOSIS — Z8249 Family history of ischemic heart disease and other diseases of the circulatory system: Secondary | ICD-10-CM

## 2016-05-06 DIAGNOSIS — I6300 Cerebral infarction due to thrombosis of unspecified precerebral artery: Secondary | ICD-10-CM | POA: Diagnosis not present

## 2016-05-06 DIAGNOSIS — Z841 Family history of disorders of kidney and ureter: Secondary | ICD-10-CM

## 2016-05-06 DIAGNOSIS — I480 Paroxysmal atrial fibrillation: Secondary | ICD-10-CM | POA: Diagnosis present

## 2016-05-06 DIAGNOSIS — I638 Other cerebral infarction: Secondary | ICD-10-CM | POA: Diagnosis not present

## 2016-05-06 DIAGNOSIS — E870 Hyperosmolality and hypernatremia: Secondary | ICD-10-CM | POA: Diagnosis not present

## 2016-05-06 DIAGNOSIS — Z452 Encounter for adjustment and management of vascular access device: Secondary | ICD-10-CM

## 2016-05-06 DIAGNOSIS — Z7189 Other specified counseling: Secondary | ICD-10-CM

## 2016-05-06 DIAGNOSIS — Z87891 Personal history of nicotine dependence: Secondary | ICD-10-CM

## 2016-05-06 DIAGNOSIS — I5023 Acute on chronic systolic (congestive) heart failure: Secondary | ICD-10-CM | POA: Diagnosis present

## 2016-05-06 DIAGNOSIS — R29717 NIHSS score 17: Secondary | ICD-10-CM | POA: Diagnosis present

## 2016-05-06 DIAGNOSIS — I10 Essential (primary) hypertension: Secondary | ICD-10-CM | POA: Diagnosis not present

## 2016-05-06 DIAGNOSIS — E039 Hypothyroidism, unspecified: Secondary | ICD-10-CM | POA: Diagnosis present

## 2016-05-06 DIAGNOSIS — Z79899 Other long term (current) drug therapy: Secondary | ICD-10-CM

## 2016-05-06 DIAGNOSIS — I472 Ventricular tachycardia: Secondary | ICD-10-CM | POA: Diagnosis present

## 2016-05-06 DIAGNOSIS — J156 Pneumonia due to other aerobic Gram-negative bacteria: Secondary | ICD-10-CM | POA: Diagnosis not present

## 2016-05-06 DIAGNOSIS — R4701 Aphasia: Secondary | ICD-10-CM | POA: Diagnosis present

## 2016-05-06 DIAGNOSIS — Z6825 Body mass index (BMI) 25.0-25.9, adult: Secondary | ICD-10-CM

## 2016-05-06 DIAGNOSIS — G936 Cerebral edema: Secondary | ICD-10-CM | POA: Diagnosis present

## 2016-05-06 DIAGNOSIS — I63511 Cerebral infarction due to unspecified occlusion or stenosis of right middle cerebral artery: Secondary | ICD-10-CM | POA: Diagnosis not present

## 2016-05-06 DIAGNOSIS — R1310 Dysphagia, unspecified: Secondary | ICD-10-CM | POA: Diagnosis present

## 2016-05-06 DIAGNOSIS — I639 Cerebral infarction, unspecified: Secondary | ICD-10-CM | POA: Diagnosis not present

## 2016-05-06 DIAGNOSIS — I69334 Monoplegia of upper limb following cerebral infarction affecting left non-dominant side: Secondary | ICD-10-CM

## 2016-05-06 HISTORY — DX: Hyperlipidemia, unspecified: E78.5

## 2016-05-06 HISTORY — DX: Chronic kidney disease, unspecified: N18.9

## 2016-05-06 HISTORY — DX: Nontraumatic intracerebral hemorrhage, unspecified: I61.9

## 2016-05-06 HISTORY — DX: Cerebral infarction, unspecified: I63.9

## 2016-05-06 HISTORY — DX: Atherosclerotic heart disease of native coronary artery without angina pectoris: I25.10

## 2016-05-06 HISTORY — DX: Essential (primary) hypertension: I10

## 2016-05-06 HISTORY — DX: Gout, unspecified: M10.9

## 2016-05-06 HISTORY — DX: Unspecified atrial fibrillation: I48.91

## 2016-05-06 HISTORY — DX: Hypothyroidism, unspecified: E03.9

## 2016-05-06 LAB — COMPREHENSIVE METABOLIC PANEL WITH GFR
ALT: 20 U/L (ref 17–63)
AST: 50 U/L — ABNORMAL HIGH (ref 15–41)
Albumin: 2.9 g/dL — ABNORMAL LOW (ref 3.5–5.0)
Alkaline Phosphatase: 93 U/L (ref 38–126)
Anion gap: 14 (ref 5–15)
BUN: 13 mg/dL (ref 6–20)
CO2: 21 mmol/L — ABNORMAL LOW (ref 22–32)
Calcium: 8.6 mg/dL — ABNORMAL LOW (ref 8.9–10.3)
Chloride: 104 mmol/L (ref 101–111)
Creatinine, Ser: 1.78 mg/dL — ABNORMAL HIGH (ref 0.61–1.24)
GFR calc Af Amer: 44 mL/min — ABNORMAL LOW
GFR calc non Af Amer: 38 mL/min — ABNORMAL LOW
Glucose, Bld: 111 mg/dL — ABNORMAL HIGH (ref 65–99)
Potassium: 4 mmol/L (ref 3.5–5.1)
Sodium: 139 mmol/L (ref 135–145)
Total Bilirubin: 1.3 mg/dL — ABNORMAL HIGH (ref 0.3–1.2)
Total Protein: 8 g/dL (ref 6.5–8.1)

## 2016-05-06 LAB — POCT I-STAT 3, ART BLOOD GAS (G3+)
ACID-BASE DEFICIT: 1 mmol/L (ref 0.0–2.0)
Acid-base deficit: 2 mmol/L (ref 0.0–2.0)
BICARBONATE: 23.4 mmol/L (ref 20.0–28.0)
Bicarbonate: 25.8 mmol/L (ref 20.0–28.0)
O2 Saturation: 100 %
O2 Saturation: 93 %
PCO2 ART: 34.5 mmHg (ref 32.0–48.0)
PH ART: 7.434 (ref 7.350–7.450)
PO2 ART: 60 mmHg — AB (ref 83.0–108.0)
Patient temperature: 98.6
Patient temperature: 96.5
TCO2: 27 mmol/L (ref 0–100)
TCO2: 25 mmol/L (ref 0–100)
pCO2 arterial: 51.7 mmHg — ABNORMAL HIGH (ref 32.0–48.0)
pH, Arterial: 7.307 — ABNORMAL LOW (ref 7.350–7.450)
pO2, Arterial: 228 mmHg — ABNORMAL HIGH (ref 83.0–108.0)

## 2016-05-06 LAB — GLUCOSE, CAPILLARY
GLUCOSE-CAPILLARY: 111 mg/dL — AB (ref 65–99)
GLUCOSE-CAPILLARY: 88 mg/dL (ref 65–99)
GLUCOSE-CAPILLARY: 88 mg/dL (ref 65–99)
GLUCOSE-CAPILLARY: 98 mg/dL (ref 65–99)
Glucose-Capillary: 160 mg/dL — ABNORMAL HIGH (ref 65–99)
Glucose-Capillary: 113 mg/dL — ABNORMAL HIGH (ref 65–99)

## 2016-05-06 LAB — POCT I-STAT, CHEM 8
BUN: 20 mg/dL (ref 6–20)
CALCIUM ION: 1.11 mmol/L — AB (ref 1.15–1.40)
CREATININE: 1.6 mg/dL — AB (ref 0.61–1.24)
Chloride: 102 mmol/L (ref 101–111)
GLUCOSE: 122 mg/dL — AB (ref 65–99)
HEMATOCRIT: 60 % — AB (ref 39.0–52.0)
HEMOGLOBIN: 20.4 g/dL — AB (ref 13.0–17.0)
Potassium: 4.1 mmol/L (ref 3.5–5.1)
Sodium: 145 mmol/L (ref 135–145)
TCO2: 29 mmol/L (ref 0–100)

## 2016-05-06 LAB — CBC
HCT: 54.9 % — ABNORMAL HIGH (ref 39.0–52.0)
Hemoglobin: 18.8 g/dL — ABNORMAL HIGH (ref 13.0–17.0)
MCH: 28.1 pg (ref 26.0–34.0)
MCHC: 34.2 g/dL (ref 30.0–36.0)
MCV: 82.2 fL (ref 78.0–100.0)
PLATELETS: 193 10*3/uL (ref 150–400)
RBC: 6.68 MIL/uL — AB (ref 4.22–5.81)
RDW: 16.5 % — ABNORMAL HIGH (ref 11.5–15.5)
WBC: 8.3 10*3/uL (ref 4.0–10.5)

## 2016-05-06 LAB — SODIUM
Sodium: 139 mmol/L (ref 135–145)
Sodium: 142 mmol/L (ref 135–145)

## 2016-05-06 LAB — TRIGLYCERIDES: TRIGLYCERIDES: 53 mg/dL (ref <150)

## 2016-05-06 LAB — MRSA PCR SCREENING: MRSA BY PCR: NEGATIVE

## 2016-05-06 LAB — APTT: aPTT: 39 s — ABNORMAL HIGH (ref 24–36)

## 2016-05-06 MED ORDER — AMIODARONE HCL 200 MG PO TABS
200.0000 mg | ORAL_TABLET | Freq: Every day | ORAL | Status: DC
  Administered 2016-05-06 – 2016-05-10 (×5): 200 mg via ORAL
  Filled 2016-05-06 (×5): qty 1

## 2016-05-06 MED ORDER — SODIUM CHLORIDE 3 % IV SOLN
INTRAVENOUS | Status: DC
  Administered 2016-05-06 – 2016-05-09 (×5): 50 mL/h via INTRAVENOUS
  Filled 2016-05-06 (×14): qty 500

## 2016-05-06 MED ORDER — HYDRALAZINE HCL 20 MG/ML IJ SOLN
INTRAMUSCULAR | Status: AC
  Filled 2016-05-06: qty 1

## 2016-05-06 MED ORDER — FAMOTIDINE 40 MG/5ML PO SUSR
20.0000 mg | Freq: Two times a day (BID) | ORAL | Status: DC
  Administered 2016-05-06 – 2016-05-10 (×9): 20 mg
  Filled 2016-05-06 (×11): qty 2.5

## 2016-05-06 MED ORDER — LEVOTHYROXINE SODIUM 25 MCG PO TABS
25.0000 ug | ORAL_TABLET | Freq: Every day | ORAL | Status: DC
  Administered 2016-05-06 – 2016-05-10 (×5): 25 ug
  Filled 2016-05-06 (×5): qty 1

## 2016-05-06 MED ORDER — PROPOFOL 1000 MG/100ML IV EMUL
0.0000 ug/kg/min | INTRAVENOUS | Status: DC
  Administered 2016-05-06 (×2): 20 ug/kg/min via INTRAVENOUS
  Administered 2016-05-06 – 2016-05-07 (×3): 30 ug/kg/min via INTRAVENOUS
  Filled 2016-05-06 (×4): qty 100

## 2016-05-06 MED ORDER — INSULIN ASPART 100 UNIT/ML ~~LOC~~ SOLN
0.0000 [IU] | SUBCUTANEOUS | Status: DC
  Administered 2016-05-06: 3 [IU] via SUBCUTANEOUS
  Administered 2016-05-07 – 2016-05-10 (×10): 2 [IU] via SUBCUTANEOUS

## 2016-05-06 MED ORDER — HYDRALAZINE HCL 20 MG/ML IJ SOLN
10.0000 mg | INTRAMUSCULAR | Status: DC | PRN
  Administered 2016-05-09 – 2016-05-11 (×3): 10 mg via INTRAVENOUS
  Filled 2016-05-06 (×3): qty 1

## 2016-05-06 MED ORDER — PROPOFOL 1000 MG/100ML IV EMUL
0.0000 ug/kg/min | INTRAVENOUS | Status: DC
  Administered 2016-05-06: 10 ug/kg/min via INTRAVENOUS

## 2016-05-06 MED ORDER — CHLORHEXIDINE GLUCONATE 0.12% ORAL RINSE (MEDLINE KIT)
15.0000 mL | Freq: Two times a day (BID) | OROMUCOSAL | Status: DC
  Administered 2016-05-06 – 2016-05-10 (×10): 15 mL via OROMUCOSAL

## 2016-05-06 MED ORDER — FAMOTIDINE IN NACL 20-0.9 MG/50ML-% IV SOLN: 20.0000 mg | Freq: Two times a day (BID) | INTRAVENOUS | Status: DC

## 2016-05-06 MED ORDER — FENTANYL CITRATE (PF) 100 MCG/2ML IJ SOLN
50.0000 ug | INTRAMUSCULAR | Status: DC | PRN
  Administered 2016-05-06 – 2016-05-10 (×10): 50 ug via INTRAVENOUS
  Administered 2016-05-10: 25 ug via INTRAVENOUS
  Filled 2016-05-06 (×11): qty 2

## 2016-05-06 MED ORDER — HEPARIN SODIUM (PORCINE) 5000 UNIT/ML IJ SOLN
5000.0000 [IU] | Freq: Three times a day (TID) | INTRAMUSCULAR | Status: DC
  Administered 2016-05-06 – 2016-05-08 (×7): 5000 [IU] via SUBCUTANEOUS
  Filled 2016-05-06 (×6): qty 1

## 2016-05-06 MED ORDER — FENTANYL CITRATE (PF) 100 MCG/2ML IJ SOLN: 50.0000 ug | INTRAMUSCULAR | Status: DC | PRN

## 2016-05-06 MED ORDER — SODIUM CHLORIDE 0.9 % IV SOLN
INTRAVENOUS | Status: DC
  Administered 2016-05-06 – 2016-05-10 (×3): via INTRAVENOUS

## 2016-05-06 MED ORDER — ORAL CARE MOUTH RINSE: 15.0000 mL | OROMUCOSAL | Status: DC

## 2016-05-06 MED ORDER — ORAL CARE MOUTH RINSE
15.0000 mL | Freq: Four times a day (QID) | OROMUCOSAL | Status: DC
  Administered 2016-05-06 – 2016-05-07 (×4): 15 mL via OROMUCOSAL

## 2016-05-06 MED ORDER — CHLORHEXIDINE GLUCONATE 0.12% ORAL RINSE (MEDLINE KIT): 15.0000 mL | Freq: Two times a day (BID) | OROMUCOSAL | Status: DC

## 2016-05-06 MED ORDER — IOPAMIDOL (ISOVUE-370) INJECTION 76%
INTRAVENOUS | Status: AC
  Administered 2016-05-06: 40 mL
  Filled 2016-05-06: qty 100

## 2016-05-06 NOTE — Progress Notes (Signed)
Initial Nutrition Assessment  INTERVENTION:   If remains intubated recommend: Vital AF 1.2 start @ 35 ml/hr and increase by 10 ml every 8 hours to goal of 65 ml/hr (1560 ml/day) Provides: 1872 kcal, 117 grams protein, and 1265 ml H2O.   Recommend monitoring magnesium, potassium, and phosphorus daily for at least 3 days, MD to replete as needed, as pt is at risk for refeeding syndrome given severe malnutrition.  NUTRITION DIAGNOSIS:   Malnutrition (Severe) related to chronic illness (CKD) as evidenced by NPO status, severe depletion of body fat, severe depletion of muscle mass, energy intake < or equal to 75% for > or equal to 1 month.  GOAL:   Patient will meet greater than or equal to 90% of their needs  MONITOR:   Vent status, I & O's  REASON FOR ASSESSMENT:   Ventilator    ASSESSMENT:   Pt with hx of previous strokes and left sided weakness was admitted from [facility] with evolving large R MCA infarct no tPA or IR intervention.    Wife at bedside and reports that usual weight is 175 lb but that it fluctuates due to fluid. He used to wear a 40 in pants and is now down to 34. Unsure if weight in chart is stated or measured.  His appetite has been decreased for the last 6 months after his dx of CKD. He typically eats one meal a day but sometimes two. Breakfast is cereal and if he eats lunch it is a peanut butter and jelly. Pt does love chocolate and will eat it but did not like ensure/boost, even chocolate flavors. Wife states that the kidney doctor recently told the patient and wife that he was malnourished so she tries to get him to eat but he does not have an appetite.   Patient is currently intubated on ventilator support MV: 13.3 L/min Temp (24hrs), Avg:97.9 F (36.6 C), Min:96.5 F (35.8 C), Max:99.3 F (37.4 C)  Medications reviewed and include: synthroid, 3% saline Labs reviewed Nutrition-Focused physical exam completed. Findings are severe fat depletion, severe  muscle depletion, and moderate edema.  Wounds on bilateral lower extremities noted, per wife he will get blisters which eventually pop and then they get better.   Diet Order:  Diet NPO time specified  Skin:  Wound (see comment) (Lower extremity ulcers)  Last BM:  unknown  Height:   Ht Readings from Last 1 Encounters:  05/06/16 5\' 9"  (1.753 m)    Weight:   Wt Readings from Last 1 Encounters:  05/06/16 175 lb 0.7 oz (79.4 kg)    Ideal Body Weight:  72.7 kg  BMI:  Body mass index is 25.85 kg/m.  Estimated Nutritional Needs:   Kcal:  1885  Protein:  115-125 grams  Fluid:  > 1.8 L/day  EDUCATION NEEDS:   No education needs identified at this time  Kendell BaneHeather Loring Liskey RD, LDN, CNSC [phone] Pager [phone] After Hours Pager

## 2016-05-06 NOTE — Progress Notes (Signed)
Stroke team received phone call from CCM at 07:56 relating to a patient that was in [facility] with a possible stroke. No code stroke was called. The patient is a 65 yo male who [facility] Lt sided weakness. He had previous strokes with Lt sided weakness. CT and CTA were done. SOC noted the imaging showed large vessel occlusion of proximal Rt M1 segment. There was concern for flash pulmonary edema and he was intubated.  [facility] management via CCM. Upon Stroke Team and neurologist arrival, the patient was intubated on propofol and was not following commands or opening eyes to painful stimuli. He was spontaneously moving his right side with gravity eliminated. LUE was contracted and LLE had increased tone but no movement. Kidney labs were checked and the patient was taken for a head CT and CTP. CT showed evolving large right MCA infarct, with areas of parenchymal contrast staining from the CTA performed at 0330 hours today. Mild mass effect on the right lateral ventricle without midline shift. No hemorrhagic transformation suspected. ASPECTS is at best 3 and at worst 0. Full NIHSS performed, see documentation. Patient not a candidate for tPA r/t prior IVH and out of the window. IR intervention was not an option r/t ASPECTS score. Patient taken back to [facility]. Bedside handoff to 2H RN Huntley DecSara.

## 2016-05-06 NOTE — Procedures (Signed)
Central Venous Catheter Insertion Procedure Note Haig ProphetClaude D Jolicoeur [id number] 08-14-1950  Procedure: Insertion of Central Venous Catheter Indications: Assessment of intravascular volume, Drug and/or fluid administration and Frequent blood sampling  Procedure Details Consent: Risks of procedure as well as the alternatives and risks of each were explained to the (patient/caregiver).  Consent for procedure obtained. Time Out: Verified patient identification, verified procedure, site/side was marked, verified correct patient position, special equipment/implants available, medications/allergies/relevent history reviewed, required imaging and test results available.  Performed  Maximum sterile technique was used including antiseptics, cap, gloves, gown, hand hygiene, mask and sheet. Skin prep: Chlorhexidine; local anesthetic administered A antimicrobial bonded/coated triple lumen catheter was placed in the left internal jugular vein using the Seldinger technique. Ultrasound guidance used.Yes.   Catheter placed to 20 cm. Blood aspirated via all 3 ports and then flushed x 3. Line sutured x 2 and dressing applied.  Evaluation Blood flow good Complications: No apparent complications Patient did tolerate procedure well. Chest X-ray ordered to verify placement.  CXR: pending.  Brett CanalesSteve Kentarius Partington ACNP [facility] Pager [phone] till 3 pm If no answer page [phone] 05/06/2016, 10:47 AM

## 2016-05-06 NOTE — H&P (Addendum)
PULMONARY / CRITICAL CARE MEDICINE   Name: [name] MRN: [id number] DOB: Dec 09, 1950    ADMISSION DATE:  05/06/2016  CHIEF COMPLAINT:  Stroke  HISTORY OF PRESENT ILLNESS:   Hx from chart.    65 yo male from [facility] after presenting with Lt sided weakness.  He had previous stroke with Lt sided weakness.  CT head showed large vessel occlusion of proximal Rt M1 segment.  There was concern for flash pulmonary edema and he was intubated.  He was then transferred to [facility] for further management.  PAST MEDICAL HISTORY :  He  has a past medical history of Atrial fibrillation (HCC); CKD (chronic kidney disease); CVA (cerebral vascular accident) (HCC); Gout; Hypertension; Hypothyroidism; and ICH (intracerebral hemorrhage) (HCC).  PAST SURGICAL HISTORY: He past surgical history is unknown.  No Known Allergies  OUTPATIENT MEDS: Amlodipine 5 mg daily, Amiodarone 200 mg daily, Aldactone 25 mg daily, Allopurinol 100 mg daily, Aspirin 81 mg daily, Isosorbide dinitrate 5 mg daily, Levothyroxine 25 mcg daily, Metoprolol/HCTZ 100-12.5 mg daily  FAMILY HISTORY:  Unable to obtain  SOCIAL HISTORY: Unable to obtain  REVIEW OF SYSTEMS:   Unable to obtain  SUBJECTIVE:   VITAL SIGNS: BP (!) 119/93   Pulse 71   Temp (!) 96.5 F (35.8 C) (Oral)   Resp (!) 35   SpO2 100%   HEMODYNAMICS:    VENTILATOR SETTINGS: Vent Mode: PRVC FiO2 (%):  [100 %] 100 % Set Rate:  [30 bmp] 30 bmp Vt Set:  [450 mL] 450 mL PEEP:  [10 cmH20] 10 cmH20 Plateau Pressure:  [28 cmH20] 28 cmH20  INTAKE / OUTPUT: No intake/output data recorded.  PHYSICAL EXAMINATION: General:  On vent Neuro:  Sedated, moves Rt sided, not following commands HEENT: Pupils pinpoint and reactive, ETT in place Cardiovascular:  Regular, no murmur Lungs:  B/l crackles Abdomen:  Soft, non tender, decreased BS Musculoskeletal:  1+ lymph edema lower legs Skin: venous stasis changes  LABS:  BMET No results for  input(s): NA, K, CL, CO2, BUN, CREATININE, GLUCOSE in the last 168 hours.  Electrolytes No results for input(s): CALCIUM, MG, PHOS in the last 168 hours.  CBC No results for input(s): WBC, HGB, HCT, PLT in the last 168 hours.  Coag's No results for input(s): APTT, INR in the last 168 hours.  Sepsis Markers No results for input(s): LATICACIDVEN, PROCALCITON, O2SATVEN in the last 168 hours.  ABG No results for input(s): PHART, PCO2ART, PO2ART in the last 168 hours.  Liver Enzymes No results for input(s): AST, ALT, ALKPHOS, BILITOT, ALBUMIN in the last 168 hours.  Cardiac Enzymes No results for input(s): TROPONINI, PROBNP in the last 168 hours.  Glucose No results for input(s): GLUCAP in the last 168 hours.  Imaging No results found.  Labs from [place] Reviewed.  STUDIES:  CT head 10/20 >> large vessel occlusion of proximal Rt M1 segment.    CULTURES:  ANTIBIOTICS:  SIGNIFICANT EVENTS: 10/20 transfer from [place], neuro consulted  LINES/TUBES: ETT 10/20 >>   DISCUSSION: 65 yo male presented to [facility] ER after collapsing at home and had worsening Lt sided weakness.  CT head showed proximal Rt M1 occlusion.  He was outside window for tPA.  He developed acute hypoxic respiratory failure from pulmonary edema and required intubation.  ASSESSMENT / PLAN:  PULMONARY A: Acute hypoxic respirator failure from acute pulmonary edema. P:   Full vent support  F/u CXR, ABG  CARDIOVASCULAR A:  Acute pulmonary edema. Hx of HTN, A  fib. P:  Continue amiodarone Will need determine from family why he isn't on anticoagulation Monitor hemodynamics Check lipids F/u Echo  RENAL A:   Hx of CKD. P:   Check BMET  GASTROINTESTINAL A:   Nutrition. P:   NPO  Tube feeds if unable to extubate soon Pepcid for SUP  HEMATOLOGIC A:   No acute issues. P:  F/u CBC SQ heparin, SCD's for DVT prophylaxis  INFECTIOUS A:   No evidence for infection. P:   Monitor  clinically  ENDOCRINE A:   Hx of hypothyroidism.   P:   Continue Synthroid Check HbA1C  NEUROLOGIC A:   Acute CVA with hx of prior CVA and Rt thalamic ICH. P:   RASS goal: 0 Per neurology  D/w neurology   CC time 43 minutes.  Coralyn HellingVineet Sanjith Siwek, MD [facility] 05/06/2016, 8:13 AM Pager:  [phone] After 3pm call: [phone]   Spoke with pt's wife over phone.  She states he was previously on plavix but developed ICH and this was stopped.  Advised her that neurology will discuss further plans once repeat neuroimaging done.  Coralyn HellingVineet Dalonte Hardage, MD [facility] 05/06/2016, 9:27 AM Pager:  [phone] After 3pm call: [phone]

## 2016-05-06 NOTE — Consult Note (Signed)
Referring Physician: Dr Halford Chessman    Chief Complaint: Stroke  HPI: [name] is an 65 y.o. 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 Per his wife he was fine last night at 10 PM. At 153 he called his daughter and told her something wasn't right and a voicemail message. At 2 AM wife found patient down and dazed, he was awake but not answering questions. EMS was called and he was taken to [facility] as a code stroke. Patient presented at 2:38 AM this morning to [facility] for worsening left-sided weakness. At baseline he does have left arm weakness from previous stroke that he is fluent when he speaks. Wife stated she noticed worsened left-sided weakness, no witnessed seizure, but when patient was found he could not speak. NIH score at [facility] was 17. CT angiogram showed right M1 occlusion and a right MCA ischemic area of infarct on CT of the head with and aspects score of 6. He was transferred to the [facility] intubated due to pulmonary edema and respiratory failure. He was taken to CT that showed an evolving large right MCA infarct with and aspects score of 3 without hemorrhagic transformation and IR intervention was not an option.   Date last known well: 2200 Time last known well: 05/05/2016 tPA Given: No, out of window for tpa. Does not fit criteria for IR intervention.   Past Medical History:  Diagnosis Date  . Atrial fibrillation ([place])   . CAD (coronary artery disease)   . CKD (chronic kidney disease)   . CVA (cerebral vascular accident) ([place])   . Gout   . Hyperlipidemia   . Hypertension   . Hypothyroidism   . ICH (intracerebral hemorrhage) ([place])     Past Surgical  History:  Procedure Laterality Date  . CARDIAC CATHETERIZATION      Family History  Problem Relation Age of Onset  . CAD Mother   . Chronic Renal Failure Father    Social History:  reports that he has quit smoking. He has never used smokeless tobacco. He reports that he does not drink alcohol. His drug history is not on file.  Allergies: No Known Allergies  Medications:  Current Facility-Administered Medications:  .  0.9 %  sodium chloride infusion, , Intravenous, Continuous, Chesley Mires, MD, Last Rate: 10 mL/hr at 05/06/16 1029 .  amiodarone (PACERONE) tablet 200 mg, 200 mg, Oral, Daily, Chesley Mires, MD, 200 mg at 05/06/16 0951 .  chlorhexidine gluconate (MEDLINE KIT) (PERIDEX) 0.12 % solution 15 mL, 15 mL, Mouth Rinse, BID, Colbert Coyer, MD, 15 mL at 05/06/16 1033 .  famotidine (PEPCID) 40 MG/5ML suspension 20 mg, 20 mg, Per Tube, Q12H, Chesley Mires, MD .  fentaNYL (SUBLIMAZE) injection 50 mcg, 50 mcg, Intravenous, Q2H PRN, Colbert Coyer, MD .  heparin injection 5,000 Units, 5,000 Units, Subcutaneous, Q8H, Chesley Mires, MD, 5,000 Units at 05/06/16 [phone] .  hydrALAZINE (APRESOLINE) injection 10 mg, 10 mg, Intravenous, Q4H PRN, Chesley Mires, MD .  insulin aspart (novoLOG) injection 0-15 Units, 0-15 Units, Subcutaneous, Q4H, Colbert Coyer, MD, 3 Units at 05/06/16 [phone] .  levothyroxine (SYNTHROID, LEVOTHROID) tablet 25 mcg, 25 mcg, Per Tube, QAC breakfast, Chesley Mires, MD, 25 mcg at 05/06/16 0951 .  MEDLINE mouth rinse, 15 mL, Mouth Rinse, QID, Benjamine Mola  C Deterding, MD .  propofol (DIPRIVAN) 1000 MG/100ML infusion, 0-50 mcg/kg/min, Intravenous, Continuous, Colbert Coyer, MD, Last Rate: 9.5 mL/hr at 05/06/16 1034, 20 mcg/kg/min at 05/06/16 1034 .  sodium chloride (hypertonic) 3 % solution, , Intravenous, Continuous, Marliss Coots, PA-C   ROS: Could not obtain due to patient mentation and sedation  Physical Examination: Blood pressure (!) 147/107, pulse (!)  137, temperature (!) 96.5 F (35.8 C), temperature source Oral, resp. rate (!) 26, height _0  (1.753 m), weight 175 lb 0.7 oz (79.4 kg), SpO2 100 %.  Laboratory Studies:  Basic Metabolic Panel:  Recent Labs Lab 05/06/16 0830 05/06/16 0839  NA 139 145  K 4.0 4.1  CL 104 102  CO2 21*  --   GLUCOSE 111* 122*  BUN 13 20  CREATININE 1.78* 1.60*  CALCIUM 8.6*  --     Liver Function Tests:  Recent Labs Lab 05/06/16 0830  AST 50*  ALT 20  ALKPHOS 93  BILITOT 1.3*  PROT 8.0  ALBUMIN 2.9*   CBC:  Recent Labs Lab 05/06/16 0830 05/06/16 0839  WBC 8.3  --   HGB 18.8* 20.4*  HCT 54.9* 60.0*  MCV 82.2  --   PLT 193  --     Recent Labs Lab 05/06/16 0744  GLUCAP 160*    Microbiology: Results for orders placed or performed during the hospital encounter of 05/06/16  MRSA PCR Screening     Status: None   Collection Time: 05/06/16  6:48 AM  Result Value Ref Range Status   MRSA by PCR NEGATIVE NEGATIVE Final    Comment:        The GeneXpert MRSA Assay (FDA approved for NASAL specimens only), is one component of a comprehensive MRSA colonization surveillance program. It is not intended to diagnose MRSA infection nor to guide or monitor treatment for MRSA infections.     Lipid Panel:    Component Value Date/Time   CHOL  05/21/2008 0430    106        ATP III CLASSIFICATION:  <200     mg/dL   Desirable  200-239  mg/dL   Borderline High  >=240    mg/dL   High   TRIG 52 05/21/2008 0430   HDL 22 (L) 05/21/2008 0430   CHOLHDL 4.8 05/21/2008 0430   VLDL 10 05/21/2008 0430   LDLCALC  05/21/2008 0430    74        Total Cholesterol/HDL:CHD Risk Coronary Heart Disease Risk Table                     Men   Women  1/2 Average Risk   3.4   3.3    Imaging: Ct Cerebral Perfusion W Contrast  Addendum Date: 05/06/2016   ADDENDUM REPORT: 05/06/2016 09:36 ADDENDUM: I discussed in person the salient findings including un-reliability of this CT perfusion analysis  with Dr. Jaynee Eagles at 0914 hours on 05/06/2016. Electronically Signed   By: Genevie Ann M.D.   On: 05/06/2016 09:36   Result Date: 05/06/2016 CLINICAL DATA:  Code stroke. 65 year old male with right M1 occlusion and evolving right MCA infarct on head CT and CTA earlier today at [facility] Initial encounter. EXAM: CT CEREBRAL PERFUSION WITH CONTRAST TECHNIQUE: Multi detector CT perfusion of the brain performed during bolus injection of IV contrast. CONTRAST:  40 mL Isovue 370 COMPARISON:  [facility] CTA head at 0330 hours today. Head CT without contrast 0851 hours today and earlier.  FINDINGS: Note that on the most recent plain head CT at 0851 hours today there is residual parenchymal staining in the right MCA territory from the 0330 hours CTA. That parenchymal staining appears to significantly confound the CBF analysis of this CT perfusion study, such that no core infarct is detected by the soft where analysis. However, there is are fairly extensive cytotoxic edema changes in the right MCA territory on plain head CT, with ASPECTS decreasing since 0246 hours today from a score of 6-7, now to a score of 0-3. Therefore, the perfusion findings here are unreliable. Furthermore, the CT perfusion source images suggest improved right MCA M1 patency since 0330 hours today. IMPRESSION: 1. CT Brain Perfusion analysis significantly confounded by right MCA territory parenchymal staining from the 0330 hours CTA today. 2. Large area of infarct core indicated on the basis of plain CT (current ASPECTS 0-3) is not detected on this perfusion study. 3. Furthermore, the CT Perfusion source images suggest there has been some interval Re-cannulation of the right MCA M1 since 0330 hours. Electronically Signed: By: Genevie Ann M.D. On: 05/06/2016 09:27   Dg Chest Port 1 View  Result Date: 05/06/2016 CLINICAL DATA:  Endotracheal tube present. EXAM: PORTABLE CHEST 1 VIEW COMPARISON:  05/06/2016 FINDINGS: Endotracheal tube is roughly  4.4 cm above the carina. Nasogastric tube extends into the abdomen. Bilateral airspace disease has decreased from the previous examination. Heart size is within normal limits and stable. Negative for a pneumothorax. Atherosclerotic calcifications at the aortic arch. Old left rib fractures. IMPRESSION: Decreasing bilateral airspace disease. Support apparatuses as described. Electronically Signed   By: Markus Daft M.D.   On: 05/06/2016 09:00   Ct Head Code Stroke Wo Contrast  Result Date: 05/06/2016 CLINICAL DATA:  Code stroke. 65 year old male with M1 occlusion and evolving right MCA infarct on head CT and CTA earlier today at [facility] Initial encounter. EXAM: CT HEAD WITHOUT CONTRAST TECHNIQUE: Contiguous axial images were obtained from the base of the skull through the vertex without intravenous contrast. COMPARISON:  Head CT without contrast 0246 hours today. FINDINGS: Brain: Contrast staining of the right basal ganglia from the earlier CTA. Suggestion also of some contrast staining in the right temporal lobe parenchyma. Superimposed cytotoxic edema and hypodensity throughout much of the remainder of the right MCA territory, only sparing portions of the superior most M4 through M6 segments. No midline shift, but there is mild effacement of the right lateral ventricle. No strong evidence of hemorrhagic transformation. Basilar cisterns remain patent. Stable gray-white matter differentiation in the left hemisphere and posterior fossa. No ventriculomegaly. Vascular: Some residual intravascular contrast. Skull: No acute osseous abnormality identified. Sinuses/Orbits: Visualized paranasal sinuses and mastoids are stable and well pneumatized. Other: No acute orbit or scalp soft tissue finding. ASPECTS ([place] Stroke Program Early CT Score) - Ganglionic level infarction (caudate, lentiform nuclei, internal capsule, insula, M1-M3 cortex): 0, when considering abnormal contrast staining of the right basal  ganglia as positive for cytotoxic edema - Supraganglionic infarction (M4-M6 cortex): Essentially 0; only the superior most M4 through M6 segments are spared. Total score (0-10 with 10 being normal): 0-3 IMPRESSION: 1. Evolving large right MCA infarct, with areas of parenchymal contrast staining from the CTA performed at 0330 hours today. Mild mass effect on the right lateral ventricle without midline shift. No hemorrhagic transformation suspected. 2. ASPECTS is at best 3 and at worst 0. 3. Study discussed by telephone with NP Burnetta Sabin, and reviewed in person with Dr. Jaynee Eagles on 05/06/2016  at 09:14 hours. Electronically Signed   By: Genevie Ann M.D.   On: 05/06/2016 09:20   PHYSICAL EXAM Frail elderly african [place] gentleman who is intubated and sedated . Afebrile. Head is nontraumatic. Neck is supple without bruit. Cardiac exam no murmur or gallop. Lungs bilateral crackles. Distal pulses are well felt. Neurological Exam : Intubated. Does not open eyes to sternal rub. Globally aphasic. Pupils are small but reactive. Conjugate midline gaze. Does not blink to threat. Difficult to assess for lower facial weakness due to intubation. Does not follow any commands. Cough and gag intact. Patient moving his right side spontaneously. He has increased tone left arm and leg. Reflexes are brisk for age and medical conditions more so on the left arm and leg. Not withdrawing to noxious stim on the left. Plantars equivocal.   Assessment:  [name] is an 65 y.o. 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 Initial CT of the head aspects score 6 on repeat 3 at best, modified Rankin score of 2 at baseline (patient is independent, fluent, drives and walks independently, manages his own affairs at home but needs some help with dressing and ADLs).   Stroke Risk Factors - atrial fibrillation not on anticoagulation due to previous intracranial hemorrhage, hypertension, age, hyperlipidemia, previous suspected cardioembolic strokes, coronary artery disease.  I had a long discussion with wife at the bedside who is a retired [employer] scientist. Patient is very knowledgeable and understands that patient's prognosis is poor for good quality of life at outcome.   Plan: 1. HgbA1c, fasting lipid panel 2. MRI, MRA  of the brain without contrast 3. PT consult, OT consult, Speech consult 4. Echocardiogram 5. Carotid dopplers 6. Prophylactic therapy- Aspirin  7. NPO until RN stroke swallow screen 8. Telemetry monitoring 9. Frequent neuro checks 10. Start hypertonic saline   Personally examined patient and images, and have participated in and made any corrections needed to history, physical, neuro exam,assessment and plan as stated above.  I have personally obtained the history, evaluated lab date, reviewed imaging studies and agree with radiology interpretations.    Sarina Ill, MD Stroke Neurology [phone] [facility]

## 2016-05-07 ENCOUNTER — Encounter (HOSPITAL_COMMUNITY): Payer: Self-pay | Admitting: *Deleted

## 2016-05-07 DIAGNOSIS — J9601 Acute respiratory failure with hypoxia: Secondary | ICD-10-CM

## 2016-05-07 DIAGNOSIS — I63511 Cerebral infarction due to unspecified occlusion or stenosis of right middle cerebral artery: Secondary | ICD-10-CM

## 2016-05-07 LAB — GLUCOSE, CAPILLARY
GLUCOSE-CAPILLARY: 70 mg/dL (ref 65–99)
GLUCOSE-CAPILLARY: 121 mg/dL — AB (ref 65–99)
Glucose-Capillary: 101 mg/dL — ABNORMAL HIGH (ref 65–99)
Glucose-Capillary: 91 mg/dL (ref 65–99)
Glucose-Capillary: 127 mg/dL — ABNORMAL HIGH (ref 65–99)
Glucose-Capillary: 117 mg/dL — ABNORMAL HIGH (ref 65–99)

## 2016-05-07 LAB — PROTIME-INR
INR: 1.41
PROTHROMBIN TIME: 17.3 s — AB (ref 11.4–15.2)

## 2016-05-07 LAB — BASIC METABOLIC PANEL
ANION GAP: 8 (ref 5–15)
BUN: 14 mg/dL (ref 6–20)
CALCIUM: 7.6 mg/dL — AB (ref 8.9–10.3)
CO2: 22 mmol/L (ref 22–32)
Chloride: 116 mmol/L — ABNORMAL HIGH (ref 101–111)
Creatinine, Ser: 1.8 mg/dL — ABNORMAL HIGH (ref 0.61–1.24)
GFR calc Af Amer: 44 mL/min — ABNORMAL LOW (ref >60)
GFR, EST NON AFRICAN AMERICAN: 38 mL/min — AB (ref >60)
Glucose, Bld: 105 mg/dL — ABNORMAL HIGH (ref 65–99)
POTASSIUM: 3.2 mmol/L — AB (ref 3.5–5.1)
Sodium: 146 mmol/L — ABNORMAL HIGH (ref 135–145)

## 2016-05-07 LAB — CBC
HCT: 40.2 % (ref 39.0–52.0)
Hemoglobin: 13.9 g/dL (ref 13.0–17.0)
MCH: 27.2 pg (ref 26.0–34.0)
MCHC: 34.6 g/dL (ref 30.0–36.0)
MCV: 78.7 fL (ref 78.0–100.0)
Platelets: 148 K/uL — ABNORMAL LOW (ref 150–400)
RBC: 5.11 MIL/uL (ref 4.22–5.81)
RDW: 16.1 % — ABNORMAL HIGH (ref 11.5–15.5)
WBC: 6 K/uL (ref 4.0–10.5)

## 2016-05-07 LAB — LIPID PANEL
Cholesterol: 117 mg/dL (ref 0–200)
HDL: 26 mg/dL — ABNORMAL LOW
LDL Cholesterol: 76 mg/dL (ref 0–99)
Total CHOL/HDL Ratio: 4.5 ratio
Triglycerides: 73 mg/dL
VLDL: 15 mg/dL (ref 0–40)

## 2016-05-07 LAB — PHOSPHORUS: Phosphorus: 1.9 mg/dL — ABNORMAL LOW (ref 2.5–4.6)

## 2016-05-07 LAB — SODIUM
SODIUM: 147 mmol/L — AB (ref 135–145)
Sodium: 149 mmol/L — ABNORMAL HIGH (ref 135–145)
Sodium: 152 mmol/L — ABNORMAL HIGH (ref 135–145)
Sodium: 156 mmol/L — ABNORMAL HIGH (ref 135–145)

## 2016-05-07 LAB — MAGNESIUM: Magnesium: 1.7 mg/dL (ref 1.7–2.4)

## 2016-05-07 LAB — PROCALCITONIN: Procalcitonin: 0.4 ng/mL

## 2016-05-07 MED ORDER — VITAL HIGH PROTEIN PO LIQD
1000.0000 mL | ORAL | Status: DC
  Administered 2016-05-07: 1000 mL

## 2016-05-07 MED ORDER — CHLORHEXIDINE GLUCONATE 0.12 % MT SOLN
OROMUCOSAL | Status: AC
  Filled 2016-05-07: qty 15

## 2016-05-07 MED ORDER — SODIUM PHOSPHATES 45 MMOLE/15ML IV SOLN
20.0000 mmol | Freq: Once | INTRAVENOUS | Status: AC
  Administered 2016-05-08: 20 mmol via INTRAVENOUS
  Filled 2016-05-07 (×2): qty 6.67

## 2016-05-07 MED ORDER — POTASSIUM CHLORIDE 20 MEQ/15ML (10%) PO SOLN
40.0000 meq | Freq: Once | ORAL | Status: AC
  Administered 2016-05-07: 40 meq
  Filled 2016-05-07: qty 30

## 2016-05-07 MED ORDER — ORAL CARE MOUTH RINSE
15.0000 mL | OROMUCOSAL | Status: DC
  Administered 2016-05-07 – 2016-05-10 (×31): 15 mL via OROMUCOSAL

## 2016-05-07 MED ORDER — ASPIRIN 325 MG PO TABS
325.0000 mg | ORAL_TABLET | Freq: Every day | ORAL | Status: DC
  Administered 2016-05-08: 325 mg via ORAL
  Filled 2016-05-07: qty 1

## 2016-05-07 MED ORDER — AMPICILLIN-SULBACTAM SODIUM 3 (2-1) G IJ SOLR
3.0000 g | Freq: Three times a day (TID) | INTRAMUSCULAR | Status: DC
  Administered 2016-05-07 – 2016-05-10 (×9): 3 g via INTRAVENOUS
  Filled 2016-05-07 (×10): qty 3

## 2016-05-07 MED ORDER — MAGNESIUM SULFATE IN D5W 1-5 GM/100ML-% IV SOLN
1.0000 g | Freq: Once | INTRAVENOUS | Status: AC
  Administered 2016-05-07: 1 g via INTRAVENOUS
  Filled 2016-05-07: qty 100

## 2016-05-07 MED ORDER — ASPIRIN EC 325 MG PO TBEC
325.0000 mg | DELAYED_RELEASE_TABLET | Freq: Every day | ORAL | Status: DC
  Administered 2016-05-07: 325 mg via ORAL
  Filled 2016-05-07: qty 1

## 2016-05-07 MED ORDER — PRO-STAT SUGAR FREE PO LIQD
30.0000 mL | Freq: Two times a day (BID) | ORAL | Status: DC
  Administered 2016-05-07: 30 mL
  Filled 2016-05-07 (×2): qty 30

## 2016-05-07 NOTE — Progress Notes (Signed)
eLink Physician-Brief Progress Note Patient Name: [name] DOB: 09/17/1950 MRN: [id number]   Date of Service  05/07/2016  HPI/Events of Note  Na+ = 152 - on 3% saline for CVA, PO4--- = 1.9 and Mg++ = 1.7. Creatinine = 1.8.  eICU Interventions  Will replace PO4--- and Mg++.      Intervention Category Major Interventions: Electrolyte abnormality - evaluation and management  [name],Steven Eugene 05/07/2016, 6:39 PM

## 2016-05-07 NOTE — Progress Notes (Signed)
Pharmacy Antibiotic Note  [name] is a 65 y.o. male admitted on 05/06/2016 with pneumonia.  Pharmacy has been consulted for Unasyn dosing.  Plan: Unasyn 3g IV q8h  Monitor culture data, renal function and clinical course  Height: 5\' 9"  (175.3 cm) Weight: 175 lb 0.7 oz (79.4 kg) IBW/kg (Calculated) : 70.7  Temp (24hrs), Avg:100 F (37.8 C), Min:99.3 F (37.4 C), Max:100.7 F (38.2 C)   Recent Labs Lab 05/06/16 0830 05/06/16 0839 05/07/16 0430  WBC 8.3  --  6.0  CREATININE 1.78* 1.60* 1.80*    Estimated Creatinine Clearance: 40.4 mL/min (by C-G formula based on SCr of 1.8 mg/dL (H)).    No Known Allergies  Antimicrobials this admission: Unasyn 10/21 >>    >>   Dose adjustments this admission:   Microbiology results:  BCx:   UCx:    Sputum:    MRSA PCR:    Arlean Hoppingorey M. Newman PiesBall, PharmD, BCPS Clinical Pharmacist Pager [phone] 05/07/2016 4:58 PM

## 2016-05-07 NOTE — Progress Notes (Addendum)
STROKE TEAM PROGRESS NOTE   HISTORY OF PRESENT ILLNESS (per record) [name] is an 65 y.o. 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 Per his wife he was fine last night at 10 PM. At 153 he called his daughter and told her something wasn't right and a voicemail message. At 2 AM wife found patient down and dazed, he was awake but not answering questions. EMS was called and he was taken to [facility] as a code stroke. Patient presented at 2:38 AM this morning to [facility] for worsening left-sided weakness. At baseline he does have left arm weakness from previous stroke that he is fluent when he speaks. Wife stated she noticed worsened left-sided weakness, no witnessed seizure, but when patient was found he could not speak. NIH score at [facility] was 17. CT angiogram showed right M1 occlusion and a right MCA ischemic area of infarct on CT of the head with and aspects score of 6. He was transferred to the [facility] intubated due to pulmonary edema and respiratory failure. He was taken to CT that showed an evolving large right MCA infarct with and aspects score of 3 without hemorrhagic transformation and IR intervention was not an option.   Date last known well: 2200 Time last known well: 05/05/2016 tPA Given: No, out of window for tpa. Does not fit criteria for IR intervention.    SUBJECTIVE (INTERVAL HISTORY) His wife, son, daughter-in-law and aunt were available to meet in the conference room. We had a lengthy conversation regarding [name]'s diagnosis and prognosis.  They were able to recap the discussion and clearly understand the gravity of the situation.  We discussed his  code status and all agreed to continue to provide aggressive medical management at this time and also elected to make him DNR if he were to deteriorate.  Overall his neurological condition is unchanged since admission.  This exam was performed off of Propofol.  Sedation was off for 45 minutes prior to the exam.   OBJECTIVE Temp:  [99.3 F (37.4 C)-100.4 F (38 C)] 99.3 F (37.4 C) (10/21 0401) Pulse Rate:  [60-137] 78 (10/21 0700) Cardiac Rhythm: Normal sinus rhythm (10/21 0400) Resp:  [16-38] 26 (10/21 0700) BP: (109-164)/(71-109) 140/90 (10/21 0700) SpO2:  [95 %-100 %] 100 % (10/21 0700) FiO2 (%):  [30 %-100 %] 30 % (10/21 0400) Weight:  [79.4 kg (175 lb 0.7 oz)] 79.4 kg (175 lb 0.7 oz) (10/20 0942)  CBC:  Recent Labs Lab 05/06/16 0830 05/06/16 0839 05/07/16 0430  WBC 8.3  --  6.0  HGB 18.8* 20.4* 13.9  HCT 54.9* 60.0* 40.2  MCV 82.2  --  78.7  PLT 193  --  148*    Basic Metabolic Panel:  Recent Labs Lab 05/06/16 0830 05/06/16 0839  05/06/16 1928 05/07/16 0430  NA 139 145  < > 142 147*  146*  K 4.0 4.1  --   --  3.2*  CL 104 102  --   --  116*  CO2 21*  --   --   --  22  GLUCOSE 111* 122*  --   --  105*  BUN 13 20  --   --  14  CREATININE 1.78* 1.60*  --   --  1.80*  CALCIUM 8.6*  --   --   --  7.6*  < > = values in this interval not displayed.  Lipid Panel:    Component Value Date/Time   CHOL 117 05/07/2016 0430   TRIG 73 05/07/2016 0430   HDL 26 (L) 05/07/2016 0430   CHOLHDL 4.5 05/07/2016 0430   VLDL 15 05/07/2016 0430   LDLCALC 76 05/07/2016 0430   HgbA1c: No results found for: HGBA1C Urine Drug Screen: No results found for: LABOPIA, COCAINSCRNUR, LABBENZ, AMPHETMU, THCU, LABBARB    IMAGING  Ct Cerebral Perfusion W Contrast 05/06/2016   1. CT Brain Perfusion analysis significantly confounded by right MCA territory parenchymal staining from the 0330 hours CTA today.  2. Large area of infarct core indicated on the basis of plain CT (current ASPECTS  0-3) is not detected on this perfusion study. 3. Furthermore, the CT Perfusion source images suggest there has been some interval Re-cannulation of the right MCA M1 since 0330 hours.   Dg Chest Port 1 View 05/06/2016 Stable bilateral lung opacities as described above. Interval placement of left internal jugular catheter with distal tip in expected position of cavoatrial junction. No pneumothorax is noted.    Dg Chest Port 1 View 05/06/2016 Decreasing bilateral airspace disease. Support apparatuses as described.     Ct Head Code Stroke Wo Contrast 05/06/2016 1. Evolving large right MCA infarct, with areas of parenchymal contrast staining from the CTA performed at 0330 hours today. Mild mass effect on the right lateral ventricle without midline shift. No hemorrhagic transformation suspected.  2. ASPECTS is at best 3 and at worst 0.    PHYSICAL EXAM Frail elderly african [place] gentleman who is intubated and sedated . Afebrile. Head is nontraumatic. Neck is supple without bruit. Cardiac exam no murmur or gallop. Lungs bilateral crackles. Distal pulses are well felt.  Neurological Exam : Intubated. Does not open eyes to sternal rub.  Off of sedation.  Does not follow commands  Pupils are small but reactive. Conjugate midline gaze. Does not blink to threat. Difficult to assess for lower facial weakness due to intubation. Corneal reflex intact.  Cough and gag intact.   Patient moves his right side spontaneously. He has increased tone left arm and leg on the left which may reflect acute on chronic injury.   Not withdrawing to noxious stim on the left.   Coordination and gait deferred at this time due to LOC  ASSESSMENT/PLAN [name] is a 65 y.o. 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 He did not receive IV t-PA due to late presentation.  Stroke:  Non-dominant infarct embolic secondary to atrial fibrillation.  Resultant  Left sided weakness and decreased LOC  MRI - not performed.  MRA - not performed  CT - Evolving large right MCA infarct  Carotid Doppler - not ordered  2D Echo - not ordered  LDL - 76  HgbA1c pending  VTE prophylaxis - subcutaneous Heparin / SCDs  Diet NPO time specified  aspirin 81 mg daily prior to admission, now on No antithrombotic  Ongoing aggressive stroke risk factor management  Therapy recommendations:  pending  Disposition: pending  Hypertension  Stable  Permissive hypertension (OK if < 220/120) but gradually normalize in 5-7 days  Long-term BP goal normotensive  Hyperlipidemia  Home meds: ?  LDL -  76, goal < 70  Add statin when taking PO's.  Continue statin at discharge  Other Stroke Risk Factors  Advanced age  Former cigarette smoker - quit in the past  Hx stroke/TIA  Coronary artery disease  Atrial Fibrillation - not anticoagulated  Other Active Problems  Hypertonic Saline - Na - 147  CKD - 14 / 1.8  Hypokalemia - 3.2  Temp 99.8  Intubated  Currently not on antithrombotic medication.   Hospital day # 1  CRITICAL CARE NEUROLOGY ATTENDING NOTE Patient was seen and examined by me personally. I independently viewed imaging studies, participated in medical decision making and plan of care.  The laboratory and radiographic studies were personally reviewed by me.  ROS completed by me personally and pertinent positives could not be fully documented due to LOC  Assessment and plan completed by me personally and documented above.  Condition is unchanged  Plan:  Repeat CT scan of the head in the AM.  Given the severity of illness and likely long-term plan of  care/family decisions, we will not do an MRI or carotid dopplers at this time.  Should discussions with Palliative Care reveal a family decisions for a more aggressive plan for care despite prognosis or an unexpected improvement in his condition, will re-evaluate need  ECHO to rule out frank clot.  This finding would indicate significantly hirer co-morbidity and risk given the fact patient can not be anticoagulated to to size of current stroke placing him at significant risk for hemorrhagic transformation  ASA 325mg  for now; if CT reveals hemorrhagic transformation, will re-evaluate  Continue supportive care.  Continue hypertonic saline given likelihood of cerebral edema in the next few days  Letter written for family in order to provide detailed information needed for military to allow son to return from deployment in [place]   This patient is critically ill and at significant risk of neurological worsening, death and care requires constant monitoring of vital signs, hemodynamics,respiratory and cardiac monitoring, extensive review of multiple databases, frequent neurological assessment, discussion with family, other specialists and medical decision making of high complexity.  This critical care time does not reflect procedure time, or teaching time or supervisory time of PA/NP/Med Resident etc. but could involve care discussion time.  I spent 30 minutes of Neurocritical Care time in the care of  this patient.  SIGNED BY: Dr. Sula Soda    To contact Stroke Continuity provider, please refer to [email or web address]. After hours, contact General Neurology

## 2016-05-07 NOTE — Progress Notes (Signed)
PULMONARY / CRITICAL CARE MEDICINE   Name: [name] MRN: [id number] DOB: 06/03/1951    ADMISSION DATE:  05/06/2016  CHIEF COMPLAINT:  Stroke  BRIEF: 65 yo male from [facility] hospital 10/20 after presenting with Lt sided weakness.  He had previous stroke with Lt sided weakness.  CT head showed large vessel occlusion of proximal Rt M1 segment.  There was concern for flash pulmonary edema and he was intubated.  He was then transferred to [facility] for further management.  SUBJECTIVE:  RN report early am exam - pt was very sedate.  Sedation turned off, now awake/alert. Follows commands on R, no movement on L (Neuro aware).  Pending further neuro assessment with CT in am 10/22.  Using PRN fentanyl for pain (tolerating being off propofol).  ECHO pending.  Did not tolerate wean - pt became tachycardic / tachypnea  VITAL SIGNS: BP (!) 154/105   Pulse 98   Temp 99.9 F (37.7 C) (Oral)   Resp (!) 28   Ht 5\' 9"  (1.753 m)   Wt 175 lb 0.7 oz (79.4 kg)   SpO2 99%   BMI 25.85 kg/m   HEMODYNAMICS: CVP:  [7 mmHg-10 mmHg] 7 mmHg  VENTILATOR SETTINGS: Vent Mode: PSV FiO2 (%):  [30 %-50 %] 30 % Set Rate:  [26 bmp] 26 bmp Vt Set:  [550 mL] 550 mL PEEP:  [5 cmH20] 5 cmH20 Pressure Support:  [10 cmH20] 10 cmH20 Plateau Pressure:  [19 cmH20] 19 cmH20  INTAKE / OUTPUT: I/O last 3 completed shifts: In: 1246.8 [I.V.:1246.8] Out: 1590 [Urine:1590]  PHYSICAL EXAMINATION: General:  Chronically ill appearing male in NAD Neuro:  Sedated, moves Rt sided, following commands, no movement on L HEENT: Pupils reactive, Tracks, ETT in place Cardiovascular:  Regular, no murmur Lungs: non-labored, lungs bilaterally coarse Abdomen:  Soft, non tender, decreased BS Musculoskeletal:  No acute deformities Skin: venous stasis changes, warm/dry, mild edema   LABS:  BMET  Recent Labs Lab 05/06/16 0830 05/06/16 0839  05/07/16 0430 05/07/16 0930 05/07/16 1449  NA 139 145  < > 147*  146* 149* 152*   K 4.0 4.1  --  3.2*  --   --   CL 104 102  --  116*  --   --   CO2 21*  --   --  22  --   --   BUN 13 20  --  14  --   --   CREATININE 1.78* 1.60*  --  1.80*  --   --   GLUCOSE 111* 122*  --  105*  --   --   < > = values in this interval not displayed.  Electrolytes  Recent Labs Lab 05/06/16 0830 05/07/16 0430  CALCIUM 8.6* 7.6*    CBC  Recent Labs Lab 05/06/16 0830 05/06/16 0839 05/07/16 0430  WBC 8.3  --  6.0  HGB 18.8* 20.4* 13.9  HCT 54.9* 60.0* 40.2  PLT 193  --  148*    Coag's  Recent Labs Lab 05/06/16 0955 05/07/16 0430  APTT 39*  --   INR  --  1.41    Sepsis Markers No results for input(s): LATICACIDVEN, PROCALCITON, O2SATVEN in the last 168 hours.  ABG  Recent Labs Lab 05/06/16 0708 05/06/16 1011  PHART 7.307* 7.434  PCO2ART 51.7* 34.5  PO2ART 228.0* 60.0*    Liver Enzymes  Recent Labs Lab 05/06/16 0830  AST 50*  ALT 20  ALKPHOS 93  BILITOT 1.3*  ALBUMIN 2.9*  Cardiac Enzymes No results for input(s): TROPONINI, PROBNP in the last 168 hours.  Glucose  Recent Labs Lab 05/06/16 2021 05/06/16 2346 05/07/16 0340 05/07/16 0826 05/07/16 1237 05/07/16 1544  GLUCAP 111* 98 101* 91 127* 70    Imaging No results found.  STUDIES:  CT head 10/20 >> large vessel occlusion of proximal Rt M1 segment.   ECHO 10/21 >>   CULTURES:  ANTIBIOTICS:  SIGNIFICANT EVENTS: 10/20  [facility], neuro consulted 10/21  Waking, following commands  LINES/TUBES: ETT 10/20 >>   DISCUSSION: 65 y/o male [facility] after collapsing at home and had worsening Lt sided weakness.  CT head showed proximal Rt M1 occlusion.  He was outside window for tPA.  He developed acute hypoxic respiratory failure from pulmonary edema and required intubation.  ASSESSMENT / PLAN:  PULMONARY A: Acute hypoxic respiratory failure from acute pulmonary edema. P:   PRVC 8 cc/kg  Wean PEEP/FiO2 for sats > 90% SBT / WUA F/u CXR,  ABG See ID   CARDIOVASCULAR A:  Acute pulmonary edema. Hx of HTN AF - not on anticoagulation due to prior ICH . P:  Continue amiodarone Monitor hemodynamics ECHO pending  RENAL A:   Hx of CKD. P:   Trend BMP / UOP  Replace electrolytes as indicated   GASTROINTESTINAL A:   Nutrition. P:   Begin TF  Pepcid for SUP  HEMATOLOGIC A:   No acute issues. P:  F/u CBC SQ heparin, SCD's for DVT prophylaxis  INFECTIOUS A:   Increased Pulmonary Secretions P:   Empiric unasyn  Trend PCT  ENDOCRINE A:   Hx of hypothyroidism.   P:   Continue Synthroid Check HbA1C  NEUROLOGIC A:   Acute CVA with hx of prior CVA and Rt thalamic ICH. P:   RASS goal: 0 Further imaging per Neurology  Plan for repeat CT in am 10/22  3% NS infusion Minimize sedation as able  PRN fentanyl for pain    Canary BrimBrandi Ferne [place]ingwood, NP-C [facility] Pgr: [phone] or if no answer [phone] 05/07/2016, 4:03 PM

## 2016-05-08 ENCOUNTER — Inpatient Hospital Stay (HOSPITAL_COMMUNITY): Payer: Medicare HMO

## 2016-05-08 DIAGNOSIS — Z515 Encounter for palliative care: Secondary | ICD-10-CM

## 2016-05-08 DIAGNOSIS — G936 Cerebral edema: Secondary | ICD-10-CM

## 2016-05-08 LAB — HEMOGLOBIN A1C
HEMOGLOBIN A1C: 4.5 % — AB (ref 4.8–5.6)
Mean Plasma Glucose: 82 mg/dL

## 2016-05-08 LAB — MAGNESIUM
MAGNESIUM: 2.1 mg/dL (ref 1.7–2.4)
Magnesium: 2 mg/dL (ref 1.7–2.4)

## 2016-05-08 LAB — SODIUM
SODIUM: 154 mmol/L — AB (ref 135–145)
SODIUM: 154 mmol/L — AB (ref 135–145)
SODIUM: 150 mmol/L — AB (ref 135–145)

## 2016-05-08 LAB — OSMOLALITY
OSMOLALITY: 331 mosm/kg — AB (ref 275–295)
OSMOLALITY: 323 mosm/kg — AB (ref 275–295)

## 2016-05-08 LAB — GLUCOSE, CAPILLARY
Glucose-Capillary: 136 mg/dL — ABNORMAL HIGH (ref 65–99)
Glucose-Capillary: 132 mg/dL — ABNORMAL HIGH (ref 65–99)
Glucose-Capillary: 121 mg/dL — ABNORMAL HIGH (ref 65–99)
Glucose-Capillary: 142 mg/dL — ABNORMAL HIGH (ref 65–99)
Glucose-Capillary: 126 mg/dL — ABNORMAL HIGH (ref 65–99)
Glucose-Capillary: 115 mg/dL — ABNORMAL HIGH (ref 65–99)

## 2016-05-08 LAB — BASIC METABOLIC PANEL
ANION GAP: 5 (ref 5–15)
BUN: 17 mg/dL (ref 6–20)
CHLORIDE: 126 mmol/L — AB (ref 101–111)
CO2: 23 mmol/L (ref 22–32)
Calcium: 7.9 mg/dL — ABNORMAL LOW (ref 8.9–10.3)
Creatinine, Ser: 1.79 mg/dL — ABNORMAL HIGH (ref 0.61–1.24)
GFR calc Af Amer: 44 mL/min — ABNORMAL LOW (ref >60)
GFR, EST NON AFRICAN AMERICAN: 38 mL/min — AB (ref >60)
GLUCOSE: 139 mg/dL — AB (ref 65–99)
POTASSIUM: 3.5 mmol/L (ref 3.5–5.1)
Sodium: 154 mmol/L — ABNORMAL HIGH (ref 135–145)

## 2016-05-08 LAB — CBC
HCT: 40.6 % (ref 39.0–52.0)
Hemoglobin: 13.6 g/dL (ref 13.0–17.0)
MCH: 27.1 pg (ref 26.0–34.0)
MCHC: 33.5 g/dL (ref 30.0–36.0)
MCV: 80.9 fL (ref 78.0–100.0)
Platelets: 147 10*3/uL — ABNORMAL LOW (ref 150–400)
RBC: 5.02 MIL/uL (ref 4.22–5.81)
RDW: 16.7 % — ABNORMAL HIGH (ref 11.5–15.5)
WBC: 5.8 10*3/uL (ref 4.0–10.5)

## 2016-05-08 LAB — PHOSPHORUS
PHOSPHORUS: 2.3 mg/dL — AB (ref 2.5–4.6)
Phosphorus: 1.9 mg/dL — ABNORMAL LOW (ref 2.5–4.6)

## 2016-05-08 LAB — PROCALCITONIN: Procalcitonin: 0.34 ng/mL

## 2016-05-08 MED ORDER — CHLORHEXIDINE GLUCONATE 0.12 % MT SOLN
OROMUCOSAL | Status: AC
  Filled 2016-05-08: qty 15

## 2016-05-08 MED ORDER — POTASSIUM CHLORIDE 20 MEQ/15ML (10%) PO SOLN
40.0000 meq | Freq: Once | ORAL | Status: AC
  Administered 2016-05-08: 40 meq
  Filled 2016-05-08: qty 30

## 2016-05-08 MED ORDER — EMPTY CONTAINERS FLEXIBLE MISC
25.0000 g | Freq: Four times a day (QID) | Status: DC
  Filled 2016-05-08 (×10): qty 125

## 2016-05-08 MED ORDER — ACETAMINOPHEN 325 MG PO TABS
650.0000 mg | ORAL_TABLET | Freq: Four times a day (QID) | ORAL | Status: DC | PRN
  Administered 2016-05-08 (×2): 650 mg
  Filled 2016-05-08 (×2): qty 2

## 2016-05-08 MED ORDER — VITAL AF 1.2 CAL PO LIQD
1000.0000 mL | ORAL | Status: DC
  Administered 2016-05-08 – 2016-05-09 (×3): 1000 mL

## 2016-05-08 MED ORDER — MANNITOL 20 % IV SOLN
80.0000 g | Freq: Once | Status: DC
  Filled 2016-05-08 (×2): qty 400

## 2016-05-08 NOTE — Consult Note (Signed)
Consultation Note Date: 05/08/2016   Patient Name: [name]  DOB: 05-20-1951  MRN: [id number]  Age / Sex: 65 y.o., male  PCP: Donald Dials, MD Referring Physician: Chesley Mires, MD  Reason for Consultation: Establishing goals of care and Psychosocial/spiritual support  HPI/Patient Profile: 65 y.o. male  with past medical history of Stroke, intracranial hemorrhage, chronic kidney disease, atrial fib, hypertension, congestive heart failure, hyperlipidemia, admitted on 05/06/2016 [facility] with signs and symptoms of new stroke. He was transferred to [facility] and admitted on 05/06/2016. CT scan showed evolving right MCA stroke with mass effect, without midline shift. He is intubated but is now off of sedation and is beginning to wean. He is following simple commands such as to squeeze your hand and he is also per his wife, using his right hand 2.2 yes or no on a write board..   Clinical Assessment and Goals of Care: Patient is beginning to wean. He is breathing above the vent and is off of sedation. His wife [name] is at the bedside he is observed to be moving his right upper extremities and per his spouse, is able to point to yes or no to answer some brief simple questions. He does open his eyes to voice and is making an attempt to turn himself in the bed to command. He did have a fever last night at 101.3; his creatinine is 1.79 In speaking with patient's wife of 14 years she describes the family as patient having 2 sons one of which is in [place] but is on his way home. It is the family's hopes that he will arrive by tomorrow for further goals of care discussion. She has 2 children as well. She tells me this morning that he has indicated by pointing and shaking his head that he would not want to be reintubated if he was to further decline once extubated. She feels certain he would not want a trach  or PEG but wants to have further goals of care regarding CPR, defibrillation PEG and trach with the family wants his son arrived from [place]  His wife [name] is his healthcare proxy. He has 2 sons and 2 stepchildren. Decision making is being shared by the family    SUMMARY OF RECOMMENDATIONS   Will change full code to partial code: No intubation but do call CODE BLUE, CPR, defibrillation, pressors Palliative medicine team to follow up with Mrs. [name] to see his son has arrived from [place] to schedule further goals of care discussion Wife share she does not believe he would ever want a trach or PEG but this is to also be part of goals of care discussion. She also believes that he will elect a DO NOT RESUSCITATE but this is still pending Code Status/Advance Care Planning:  Limited code  Palliative Prophylaxis:   Aspiration, Bowel Regimen, Delirium Protocol, Frequent Pain Assessment, Oral Care and Turn Reposition  Additional Recommendations (Limitations, Scope, Preferences):  Full Scope Treatment  Psycho-social/Spiritual:   Desire for further Chaplaincy support:no  Prognosis:   Unable to determine  Discharge Planning: To Be Determined      Primary Diagnoses: Present on Admission: . CVA (cerebral vascular accident) ([place])   I have reviewed the medical record, interviewed the patient and family, and examined the patient. The following aspects are pertinent.  Past Medical History:  Diagnosis Date  . Atrial fibrillation ([place])   . CAD (coronary artery disease)   . CKD (chronic kidney disease)   . CVA (cerebral vascular accident) ([place])   . Gout   . Hyperlipidemia   . Hypertension   . Hypothyroidism   . ICH (intracerebral hemorrhage) ([facility])    Social History   Social History  . Marital status: Married    Spouse name: N/A  . Number of children: N/A  . Years of education: N/A   Social History Main Topics  . Smoking status: Former Research scientist (life sciences)  . Smokeless  tobacco: Never Used  . Alcohol use No  . Drug use: No  . Sexual activity: Not Asked   Other Topics Concern  . None   Social History Narrative  . None   Family History  Problem Relation Age of Onset  . CAD Mother   . Chronic Renal Failure Father    Scheduled Meds: . amiodarone  200 mg Oral Daily  . ampicillin-sulbactam (UNASYN) IV  3 g Intravenous Q8H  . aspirin  325 mg Oral Daily  . chlorhexidine gluconate (MEDLINE KIT)  15 mL Mouth Rinse BID  . famotidine  20 mg Per Tube Q12H  . heparin  5,000 Units Subcutaneous Q8H  . insulin aspart  0-15 Units Subcutaneous Q4H  . levothyroxine  25 mcg Per Tube QAC breakfast  . mouth rinse  15 mL Mouth Rinse 10 times per day   Continuous Infusions: . sodium chloride 10 mL/hr at 05/07/16 2236  . feeding supplement (VITAL AF 1.2 CAL) 1,000 mL (05/08/16 0915)  . sodium chloride (hypertonic) Stopped (05/08/16 0045)   PRN Meds:.acetaminophen, fentaNYL (SUBLIMAZE) injection, hydrALAZINE Medications Prior to Admission:  Prior to Admission medications   Medication Sig Start Date End Date Taking? Authorizing Provider  amiodarone (PACERONE) 200 MG tablet Take 100 mg by mouth See admin instructions. Takes on Mon / Wed / Fri / Sun   Yes Historical Provider, MD  aspirin EC 81 MG tablet Take 81 mg by mouth daily.   Yes Historical Provider, MD  Cholecalciferol (VITAMIN D PO) Take 1 capsule by mouth daily.   Yes Historical Provider, MD  colchicine 0.6 MG tablet Take 0.6 mg by mouth daily as needed (gout flare).   Yes Historical Provider, MD  furosemide (LASIX) 40 MG tablet Take 40 mg by mouth 2 (two) times daily.   Yes Historical Provider, MD  isosorbide mononitrate (IMDUR) 30 MG 24 hr tablet Take 30 mg by mouth daily.   Yes Historical Provider, MD  levothyroxine (SYNTHROID, LEVOTHROID) 50 MCG tablet Take 50 mcg by mouth daily before breakfast.   Yes Historical Provider, MD  metoprolol (LOPRESSOR) 50 MG tablet Take 25 mg by mouth 2 (two) times daily.    Yes Historical Provider, MD  naproxen sodium (ANAPROX) 220 MG tablet Take 220 mg by mouth daily as needed (general pain).   Yes Historical Provider, MD   No Known Allergies Review of Systems  Unable to perform ROS: Intubated    Physical Exam  Constitutional: He appears well-developed and well-nourished.  Cardiovascular:  irrg  Pulmonary/Chest:  Intubated  Abdominal: Soft.  Neurological:  His eyes are open he  is following simple commands such as squeezing her hand. He is able to point to yes or no on the write board Observed to be moving his right upper extremity  Skin: Skin is warm and dry.  Psychiatric:  Unable to test  Nursing note and vitals reviewed.   Vital Signs: BP 138/87 (BP Location: Left Arm)   Pulse 76   Temp 99.5 F (37.5 C) (Oral)   Resp 19   Ht '5\' 9"'$  (1.753 m)   Wt 80 kg (176 lb 5.9 oz)   SpO2 100%   BMI 26.05 kg/m  Pain Assessment: CPOT   Pain Score: Asleep   SpO2: SpO2: 100 % O2 Device:SpO2: 100 % O2 Flow Rate: .   IO: Intake/output summary:  Intake/Output Summary (Last 24 hours) at 05/08/16 1241 Last data filed at 05/08/16 1100  Gross per 24 hour  Intake             2228 ml  Output             1580 ml  Net              648 ml    LBM: Last BM Date: 05/06/16 Baseline Weight: Weight: 79.4 kg (175 lb 0.7 oz) Most recent weight: Weight: 80 kg (176 lb 5.9 oz)     Palliative Assessment/Data:   Flowsheet Rows   Flowsheet Row Most Recent Value  Intake Tab  Referral Department  Neurology  Unit at Time of Referral  Cardiac/Telemetry Unit  Palliative Care Primary Diagnosis  Neurology  Date Notified  05/07/16  Palliative Care Type  New Palliative care  Reason for referral  Clarify Goals of Care  Date of Admission  05/06/16  Date first seen by Palliative Care  05/08/16  # of days Palliative referral response time  1 Day(s)  # of days IP prior to Palliative referral  1  Clinical Assessment  Palliative Performance Scale Score  30%  Pain Max  last 24 hours  Not able to report  Pain Min Last 24 hours  Not able to report  Dyspnea Max Last 24 Hours  Not able to report  Dyspnea Min Last 24 hours  Not able to report  Nausea Max Last 24 Hours  Not able to report  Nausea Min Last 24 Hours  Not able to report  Anxiety Max Last 24 Hours  Not able to report  Anxiety Min Last 24 Hours  Not able to report  Other Max Last 24 Hours  Not able to report  Psychosocial & Spiritual Assessment  Palliative Care Outcomes  Patient/Family meeting held?  Yes  Who was at the meeting?  wife  Palliative Care Outcomes  Clarified goals of care, Provided psychosocial or spiritual support  Patient/Family wishes: Interventions discontinued/not started   Mechanical Ventilation      Time In: 0900 Time Out: 1000 Time Total: 60 min Greater than 50%  of this time was spent counseling and coordinating care related to the above assessment and plan.  Signed by: Dory Horn, NP   Please contact Palliative Medicine Team phone at [phone] for questions and concerns.  For individual provider: See Shea Evans

## 2016-05-08 NOTE — Progress Notes (Signed)
CRITICAL VALUE ALERT  Critical value received:  Serum Osmolality 323  Date of notification:  05/08/2016  Time of notification:  1952  Critical value read back:Yes.    Nurse who received alert:  Glade LloydMelissa Snow, RN  MD notified (1st page): Roseanne RenoStewart, MD   Time of first page: 2005   Responding MD:  Roseanne RenoStewart, MD  Time MD responded:  2005

## 2016-05-08 NOTE — Progress Notes (Signed)
Paged Neurology MD to make him aware that the Patients Serum Osmolality has resulted and it is a critical value of 323. Hold Mannitol until Osmolality is <320 per MD order. No new orders at this time. Will continue to monitor acutely.

## 2016-05-08 NOTE — Progress Notes (Addendum)
Earl LitesGregory, MD paged about patients critical Serum Osmolality of 331. Nursing order to hold Manittol Serum Osmolality greater than 320. MD verbal order to hold the  Mannitol and if Serum Osmolality is less than 320 then we can give it. Mannitol on hold at this time, will recheck labs when they come due and monitor acutely.    Ollis, NP notified of patients head CT results, order to stop heparin at this time. Will continue to monitor closely.

## 2016-05-08 NOTE — Progress Notes (Signed)
Brief Nutrition Note  Consult received for enteral/tube feeding initiation and management.  Adult Enteral Nutrition Protocol already initiated. TF orders changed to reflect recommendations provided on 10/20. RD to follow-up 10/23.  Admitting Dx: PULMONARY EDEMA  Body mass index is 26.05 kg/m. Pt meets criteria for overweight based on current BMI.  Labs:   Recent Labs Lab 05/06/16 0830 05/06/16 0839  05/07/16 0430  05/07/16 1519 05/07/16 2040 05/08/16 0315 05/08/16 0820  NA 139 145  < > 147*  146*  < >  --  156* 154* 154*  K 4.0 4.1  --  3.2*  --   --   --  3.5  --   CL 104 102  --  116*  --   --   --  126*  --   CO2 21*  --   --  22  --   --   --  23  --   BUN 13 20  --  14  --   --   --  17  --   CREATININE 1.78* 1.60*  --  1.80*  --   --   --  1.79*  --   CALCIUM 8.6*  --   --  7.6*  --   --   --  7.9*  --   MG  --   --   --   --   --  1.7  --  2.0  --   PHOS  --   --   --   --   --  1.9*  --  1.9*  --   GLUCOSE 111* 122*  --  105*  --   --   --  139*  --   < > = values in this interval not displayed.  Tilda FrancoLindsey Wylee Dorantes, MS, RD, LDN Pager: [phone] After Hours Pager: [phone]

## 2016-05-08 NOTE — Progress Notes (Signed)
STROKE TEAM PROGRESS NOTE   HISTORY OF PRESENT ILLNESS (per record) [name] is an 65 y.o. 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 Per his wife he was fine last night at 10 PM. At 153 he called his daughter and told her something wasn't right and a voicemail message. At 2 AM wife found patient down and dazed, he was awake but not answering questions. EMS was called and he was taken to [facility] as a code stroke. Patient presented at 2:38 AM this morning to [facility] for worsening left-sided weakness. At baseline he does have left arm weakness from previous stroke that he is fluent when he speaks. Wife stated she noticed worsened left-sided weakness, no witnessed seizure, but when patient was found he could not speak. NIH score at [facility] was 17. CT angiogram showed right M1 occlusion and a right MCA ischemic area of infarct on CT of the head with and aspects score of 6. He was transferred to the [facility] intubated due to pulmonary edema and respiratory failure. He was taken to CT that showed an evolving large right MCA infarct with and aspects score of 3 without hemorrhagic transformation and IR intervention was not an option.   Date last known well: 2200 Time last known well: 05/05/2016 tPA Given: No, out of window for tpa. Does not fit criteria for IR intervention.    SUBJECTIVE (INTERVAL HISTORY) His wife and daughter were available to meet.   We had a lengthy conversation regarding [name]'s diagnosis and prognosis.  In lay terms, I explained the challenges with using hypertonic saline when the sodium is high and the use of Mannitol if the serum osm are high.  The  wife was able to recap the discussion and clearly understood that the medical condition is very serious despite the fact the patient looks more awake than yesterday in the AM.  She discussed the case with Palliative Care earlier today and explained to me that she want to wait for decisions until the son from arrives from his military deployment (in [place]).    OBJECTIVE Temp:  [98 F (36.7 C)-101.3 F (38.5 C)] 99.5 F (37.5 C) (10/22 1126) Pulse Rate:  [66-99] 71 (10/22 1400) Cardiac Rhythm: Normal sinus rhythm (10/22 1400) Resp:  [0-31] 19 (10/22 1400) BP: (130-173)/(78-98) 130/85 (10/22 1400) SpO2:  [96 %-100 %] 100 % (10/22 1400) FiO2 (%):  [30 %] 30 % (10/22 1400) Weight:  [79.4 kg (175 lb)-80 kg (176 lb 5.9 oz)] 80 kg (176 lb 5.9 oz) (10/22 0550)  CBC:   Recent Labs Lab 05/07/16 0430 05/08/16 0315  WBC 6.0 5.8  HGB 13.9 13.6  HCT 40.2 40.6  MCV 78.7 80.9  PLT 148* 147*    Basic Metabolic Panel:  Recent Labs Lab 05/07/16 0430  05/07/16 1519  05/08/16 0315 05/08/16 0820 05/08/16 1428  NA 147*  146*  < >  --   < > 154* 154* 154*  K 3.2*  --   --   --  3.5  --   --   CL 116*  --   --   --  126*  --   --   CO2 22  --   --   --  23  --   --  GLUCOSE 105*  --   --   --  139*  --   --   BUN 14  --   --   --  17  --   --   CREATININE 1.80*  --   --   --  1.79*  --   --   CALCIUM 7.6*  --   --   --  7.9*  --   --   MG  --   --  1.7  --  2.0  --   --   PHOS  --   --  1.9*  --  1.9*  --   --   < > = values in this interval not displayed.  Lipid Panel:     Component Value Date/Time   CHOL 117 05/07/2016 0430   TRIG 73 05/07/2016 0430   HDL 26 (L) 05/07/2016 0430   CHOLHDL 4.5 05/07/2016 0430   VLDL 15 05/07/2016 0430   LDLCALC 76 05/07/2016 0430   HgbA1c:  Lab Results  Component Value Date   HGBA1C 4.5 (L) 05/07/2016   Urine Drug Screen: No results found for: LABOPIA, COCAINSCRNUR, LABBENZ, AMPHETMU, THCU, LABBARB    IMAGING  Ct Cerebral Perfusion W  Contrast 05/06/2016   1. CT Brain Perfusion analysis significantly confounded by right MCA territory parenchymal staining from the 0330 hours CTA today.  2. Large area of infarct core indicated on the basis of plain CT (current ASPECTS 0-3) is not detected on this perfusion study. 3. Furthermore, the CT Perfusion source images suggest there has been some interval Re-cannulation of the right MCA M1 since 0330 hours.   Dg Chest Port 1 View 05/06/2016 Stable bilateral lung opacities as described above. Interval placement of left internal jugular catheter with distal tip in expected position of cavoatrial junction. No pneumothorax is noted.    Dg Chest Port 1 View 05/06/2016 Decreasing bilateral airspace disease. Support apparatuses as described.    Dg Chest Port 1 View 05/08/2016 Mild interstitial changes. Significant improved aeration is noted bilaterally.   Ct Head Code Stroke Wo Contrast 05/06/2016 1. Evolving large right MCA infarct, with areas of parenchymal contrast staining from the CTA performed at 0330 hours today. Mild mass effect on the right lateral ventricle without midline shift. No hemorrhagic transformation suspected.  2. ASPECTS is at best 3 and at worst 0.    CT Head Wo Contrast 05/08/2016 Increased cerebral edema with hemorrhagic tranformation.  4mm midline shift    PHYSICAL EXAM Frail elderly african [place] gentleman who is intubated and lethargic. Patient has been febrile multiple times since last exam.    Head is nontraumatic. Neck is supple without bruit. Cardiac exam no murmur or gallop. Lungs bilateral crackles. Distal pulses are well felt.  Neurological Exam : Intubated. Off of sedation.  Slightly opens right eye.  Followed some simple commands  Pupils are reactive. He has right gaze preference but with OCR testing will cross midline.  Does not blink to threat from the left.  Difficult to assess for lower facial weakness due to intubation. Corneal  reflex intact.  Cough and gag intact.   Patient moves his right side spontaneously and to command. He withdraws to noxious stimuli in the left arm and triple flexes in the left lower extremity   Coordination and gait deferred at this time   ASSESSMENT/PLAN [name] is a 65 y.o. male with history of congestive heart failure, hypothyroidism, hypertension, hyperlipidemia, previous strokes with residual left sided spastic  hemiparesis and intracerebral hemorrhage, chronic kidney disease, coronary artery disease, atrial fibrillation only on aspirin due to previous intracranial hemorrhage   presenting with left sided weakness and aphasia. He did not receive IV t-PA due to late presentation.  Stroke:  Non-dominant infarct embolic secondary to atrial fibrillation.  Resultant  Left sided weakness   MRI - not performed.  MRA - not performed  CT - Evolving large right MCA infarct  Repeat CT - reviewed  Carotid Doppler - not ordered  2D Echo - pending  LDL - 76  HgbA1c pending  VTE prophylaxis - subcutaneous Heparin / SCDs Diet NPO time specified  aspirin 81 mg daily prior to admission, now on ASA 325 mg daily  Ongoing aggressive stroke risk factor management  Therapy recommendations:  pending  Disposition: pending  Hypertension  Stable  Permissive hypertension (OK if < 220/120) but gradually normalize in 5-7 days  Long-term BP goal normotensive  Hyperlipidemia  Home meds: ?  LDL - 76, goal < 70  Add statin when taking PO's.  Continue statin at discharge  Other Stroke Risk Factors  Advanced age  Former cigarette smoker - quit in the past  Hx stroke/TIA  Coronary artery disease  Atrial Fibrillation - not anticoagulated  Other Active Problems  Hypertonic Saline - Na - 154 ; Cl - 126  CKD - 14 / 1.8 -> 17 / 1.79  Hypokalemia - 3.2 -> 3.5  Temp 99.8 -> 101.3 oral  (IV Unasyn Day #2) Blood Cults - NG x 5 days ; C diff - neg ; CXR -  pending  Intubated    Hospital day # 2  CRITICAL CARE NEUROLOGY ATTENDING NOTE Patient was seen and examined by me personally. I independently viewed imaging studies, participated in medical decision making and plan of care.  The laboratory and radiographic studies were personally reviewed by me.  ROS completed by me personally and pertinent positives could not be fully documented due to LOC  Assessment and plan completed by me personally and documented above.  Condition is unchanged  Plan:  Repeat CT scan of the head Reviewed.  Unfortunately, hypertonic saline and Mannitol are not options given serum osm and sodium levels   Given the severity of illness and likely long-term plan of care/family decisions, we will not do an MRI or carotid dopplers at this time.  Should discussions with Palliative Care reveal a family decisions for a more aggressive plan for care despite prognosis or an unexpected improvement in his condition, will re-evaluate need  ECHO pending  ASA 325mg  and DVT prophylaxis should be held for now  Continue supportive care.  Use hypertonic saline if sodium levels allow  Will follow   This patient is critically ill and at significant risk of neurological worsening, death and care requires constant monitoring of vital signs, hemodynamics,respiratory and cardiac monitoring, extensive review of multiple databases, frequent neurological assessment, discussion with family, other specialists and medical decision making of high complexity.  This critical care time does not reflect procedure time, or teaching time or supervisory time of PA/NP/Med Resident etc. but could involve care discussion time.  I spent 30 minutes of Neurocritical Care time in the care of  this patient.  SIGNED BY: Dr. Sula Sodahere Gilad Dugger    To contact Stroke Continuity provider, please refer to [email or web address]. After hours, contact General Neurology

## 2016-05-08 NOTE — Progress Notes (Signed)
eLink Physician-Brief Progress Note Patient Name: [name] DOB: July 25, 1950 MRN: [id number]   Date of Service  05/08/2016  HPI/Events of Note  Fever to 101.5 F. Already on Tylenol PRN and Unasyn. DDx: 1 Infection - etiology uncertain vs 2. CNS. Urine is clear. PCT 0.40 >> 0.34. Decreasing PCT favors CNS etiology.   eICU Interventions  Will order: 1. Blood cultures X 2.  2. Tracheal Aspirate Culture now.  3. Continue Unasyn for now.      Intervention Category Major Interventions: Infection - evaluation and management  Sommer,Steven Eugene 05/08/2016, 5:20 PM

## 2016-05-08 NOTE — Progress Notes (Signed)
This note also relates to the following rows which could not be included: SpO2 - Cannot attach notes to unvalidated device data  Transport pt on vent to CT and back

## 2016-05-08 NOTE — Progress Notes (Signed)
CRITICAL VALUE ALERT  Critical value received:  Serum Osmolality 331  Date of notification:  05/08/2016  Time of notification:  1436  Critical value read back:Yes.    Nurse who received alert:  Glade LloydMelissa Snow, RN  MD notified (1st page):  Earl LitesGregory, MD  Time of first page:  [phone]   Responding MD:  Earl LitesGregory, MD  Time MD responded: [phone]

## 2016-05-08 NOTE — Progress Notes (Signed)
Received call from radiologist reading pt's head CT. Critical reading. Neurologist called with head CT results. Standby for new mannitol orders and instructions. Neurologist to come to bedside to speak with family. Will continue to monitor closely.

## 2016-05-08 NOTE — Progress Notes (Signed)
PULMONARY / CRITICAL CARE MEDICINE   Name: [name] MRN: [id number] DOB: 08/21/1950    ADMISSION DATE:  05/06/2016  CHIEF COMPLAINT:  Stroke  BRIEF: 65 yo [facility] after presenting with Lt sided weakness.  He had previous stroke with Lt sided weakness.  CT head showed large vessel occlusion of proximal Rt M1 segment.  There was concern for flash pulmonary edema and he was intubated.  He was then [facility] to [facility] for further management.  SUBJECTIVE:  RN reports temp 101.3, serial sodium monitoring with 3% > 154 most recent.    VITAL SIGNS: BP (!) 155/98 (BP Location: Left Arm)   Pulse 97   Temp (!) 101.3 F (38.5 C) (Oral)   Resp (!) 26   Ht 5\' 9"  (1.753 m)   Wt 176 lb 5.9 oz (80 kg)   SpO2 97%   BMI 26.05 kg/m   HEMODYNAMICS:    VENTILATOR SETTINGS: Vent Mode: PRVC FiO2 (%):  [30 %] 30 % Set Rate:  [26 bmp] 26 bmp Vt Set:  [550 mL] 550 mL PEEP:  [5 cmH20] 5 cmH20 Pressure Support:  [10 cmH20] 10 cmH20 Plateau Pressure:  [17 cmH20-19 cmH20] 18 cmH20  INTAKE / OUTPUT: I/O last 3 completed shifts: In: 3332.3 [I.V.:2314.3; NG/GT:561.3; IV Piggyback:456.7] Out: 1695 [Urine:1695]  PHYSICAL EXAMINATION: General:  Chronically ill appearing male in NAD Neuro:  Sedated, moves Rt sided, following commands, no movement on L HEENT: Pupils reactive, Tracks, ETT in place Cardiovascular:  Regular, no murmur Lungs: non-labored, lungs bilaterally coarse with few rhonchi Abdomen:  Soft, non tender, decreased BS Musculoskeletal:  No acute deformities Skin: venous stasis changes, warm/dry, mild edema   LABS:  BMET  Recent Labs Lab 05/06/16 0830 05/06/16 0839  05/07/16 0430  05/07/16 2040 05/08/16 0315 05/08/16 0820  NA 139 145  < > 147*  146*  < > 156* 154* 154*  K 4.0 4.1  --  3.2*  --   --  3.5  --   CL 104 102  --  116*  --   --  126*  --   CO2 21*  --   --  22  --   --  23  --   BUN 13 20  --  14  --   --  17  --   CREATININE  1.78* 1.60*  --  1.80*  --   --  1.79*  --   GLUCOSE 111* 122*  --  105*  --   --  139*  --   < > = values in this interval not displayed.  Electrolytes  Recent Labs Lab 05/06/16 0830 05/07/16 0430 05/07/16 1519 05/08/16 0315  CALCIUM 8.6* 7.6*  --  7.9*  MG  --   --  1.7 2.0  PHOS  --   --  1.9* 1.9*    CBC  Recent Labs Lab 05/06/16 0830 05/06/16 0839 05/07/16 0430 05/08/16 0315  WBC 8.3  --  6.0 5.8  HGB 18.8* 20.4* 13.9 13.6  HCT 54.9* 60.0* 40.2 40.6  PLT 193  --  148* 147*    Coag's  Recent Labs Lab 05/06/16 0955 05/07/16 0430  APTT 39*  --   INR  --  1.41    Sepsis Markers  Recent Labs Lab 05/07/16 1510 05/08/16 0315  PROCALCITON 0.40 0.34    ABG  Recent Labs Lab 05/06/16 0708 05/06/16 1011  PHART 7.307* 7.434  PCO2ART 51.7* 34.5  PO2ART 228.0* 60.0*  Liver Enzymes  Recent Labs Lab 05/06/16 0830  AST 50*  ALT 20  ALKPHOS 93  BILITOT 1.3*  ALBUMIN 2.9*    Cardiac Enzymes No results for input(s): TROPONINI, PROBNP in the last 168 hours.  Glucose  Recent Labs Lab 05/07/16 1544 05/07/16 2025 05/07/16 2326 05/08/16 0322 05/08/16 0403 05/08/16 0819  GLUCAP 70 117* 121* 136* 132* 121*    Imaging Dg Chest Port 1 View  Result Date: 05/08/2016 CLINICAL DATA:  Hypoxia EXAM: PORTABLE CHEST 1 VIEW COMPARISON:  05/06/2016 FINDINGS: Cardiac shadow is stable. Left central venous line, endotracheal tube and nasogastric catheter are again noted and stable. Lungs are well aerated bilaterally. Some very mild interstitial changes are seen. The infiltrative density seen on the prior exam has nearly completely resolved. No bony abnormality is seen. IMPRESSION: Mild interstitial changes. Significant improved aeration is noted bilaterally. Electronically Signed   By: Alcide Clever M.D.   On: 05/08/2016 09:02    STUDIES:  CT head 10/20 >> large vessel occlusion of proximal Rt M1 segment.   ECHO 10/21 >>    CULTURES:  ANTIBIOTICS:  SIGNIFICANT EVENTS: 10/20  transfer from [place], neuro consulted 10/21  Waking, following commands  LINES/TUBES: ETT 10/20 >>  L IJ 10/20 >>   DISCUSSION: 65 y/o male presented to [facility] ER after collapsing at home and had worsening Lt sided weakness.  CT head showed proximal Rt M1 occlusion.  He was outside window for tPA.  He developed acute hypoxic respiratory failure from pulmonary edema and required intubation.  ASSESSMENT / PLAN:  PULMONARY A: Acute hypoxic respiratory failure from acute pulmonary edema. P:   PRVC 8 cc/kg  Wean PEEP/FiO2 for sats > 90% SBT / WUA F/u CXR, ABG See ID   CARDIOVASCULAR A:  Acute pulmonary edema. Hx of HTN AF - not on anticoagulation due to prior ICH . P:  Continue amiodarone Monitor hemodynamics ECHO pending  RENAL A:   Hx of CKD. Hypokalemia  Hypophosphatemia  P:   Trend BMP / UOP  Replace electrolytes as indicated   GASTROINTESTINAL A:   Nutrition. P:   Begin TF  Pepcid for SUP  HEMATOLOGIC A:   No acute issues. P:  F/u CBC SQ heparin, SCD's for DVT prophylaxis  INFECTIOUS A:   Increased Pulmonary Secretions + Fever - no infiltrate on CXR  P:   Empiric unasyn  Trend PCT, narrow / d/c as able  ENDOCRINE A:   Hx of hypothyroidism.   P:   Continue Synthroid Check HbA1C  NEUROLOGIC A:   Acute CVA with hx of prior CVA and Rt thalamic ICH. P:   RASS goal: 0 Further imaging per Neurology  Plan for repeat CT in am 10/22 > 3% NS infusion Minimize sedation as able  PRN fentanyl for pain    Canary Brim, NP-C [facility] Pgr: [phone] or if no answer [phone] 05/08/2016, 9:11 AM

## 2016-05-09 ENCOUNTER — Inpatient Hospital Stay (HOSPITAL_COMMUNITY): Payer: Medicare HMO

## 2016-05-09 DIAGNOSIS — I639 Cerebral infarction, unspecified: Secondary | ICD-10-CM

## 2016-05-09 DIAGNOSIS — Z7189 Other specified counseling: Secondary | ICD-10-CM

## 2016-05-09 DIAGNOSIS — I638 Other cerebral infarction: Secondary | ICD-10-CM

## 2016-05-09 DIAGNOSIS — Z978 Presence of other specified devices: Secondary | ICD-10-CM

## 2016-05-09 LAB — GLUCOSE, CAPILLARY
GLUCOSE-CAPILLARY: 136 mg/dL — AB (ref 65–99)
Glucose-Capillary: 117 mg/dL — ABNORMAL HIGH (ref 65–99)
Glucose-Capillary: 120 mg/dL — ABNORMAL HIGH (ref 65–99)
Glucose-Capillary: 121 mg/dL — ABNORMAL HIGH (ref 65–99)
Glucose-Capillary: 131 mg/dL — ABNORMAL HIGH (ref 65–99)
Glucose-Capillary: 95 mg/dL (ref 65–99)

## 2016-05-09 LAB — CBC
HEMATOCRIT: 42.4 % (ref 39.0–52.0)
HEMOGLOBIN: 14.4 g/dL (ref 13.0–17.0)
MCH: 27.6 pg (ref 26.0–34.0)
MCHC: 34 g/dL (ref 30.0–36.0)
MCV: 81.2 fL (ref 78.0–100.0)
Platelets: 161 10*3/uL (ref 150–400)
RBC: 5.22 MIL/uL (ref 4.22–5.81)
RDW: 16.8 % — ABNORMAL HIGH (ref 11.5–15.5)
WBC: 8.4 10*3/uL (ref 4.0–10.5)

## 2016-05-09 LAB — PROCALCITONIN: Procalcitonin: 0.32 ng/mL

## 2016-05-09 LAB — BASIC METABOLIC PANEL
ANION GAP: 8 (ref 5–15)
BUN: 17 mg/dL (ref 6–20)
CHLORIDE: 121 mmol/L — AB (ref 101–111)
CO2: 22 mmol/L (ref 22–32)
Calcium: 8 mg/dL — ABNORMAL LOW (ref 8.9–10.3)
Creatinine, Ser: 1.43 mg/dL — ABNORMAL HIGH (ref 0.61–1.24)
GFR calc non Af Amer: 50 mL/min — ABNORMAL LOW (ref >60)
GFR, EST AFRICAN AMERICAN: 57 mL/min — AB (ref >60)
Glucose, Bld: 144 mg/dL — ABNORMAL HIGH (ref 65–99)
Potassium: 3.5 mmol/L (ref 3.5–5.1)
Sodium: 151 mmol/L — ABNORMAL HIGH (ref 135–145)

## 2016-05-09 LAB — SODIUM
SODIUM: 151 mmol/L — AB (ref 135–145)
Sodium: 150 mmol/L — ABNORMAL HIGH (ref 135–145)
Sodium: 152 mmol/L — ABNORMAL HIGH (ref 135–145)

## 2016-05-09 LAB — TRIGLYCERIDES: Triglycerides: 83 mg/dL (ref <150)

## 2016-05-09 LAB — OSMOLALITY
OSMOLALITY: 323 mosm/kg — AB (ref 275–295)
Osmolality: 320 mOsm/kg — ABNORMAL HIGH (ref 275–295)

## 2016-05-09 LAB — PHOSPHORUS: Phosphorus: 1.9 mg/dL — ABNORMAL LOW (ref 2.5–4.6)

## 2016-05-09 LAB — MAGNESIUM: Magnesium: 1.9 mg/dL (ref 1.7–2.4)

## 2016-05-09 MED ORDER — LABETALOL HCL 5 MG/ML IV SOLN
INTRAVENOUS | Status: AC
  Filled 2016-05-09: qty 4

## 2016-05-09 MED ORDER — METOPROLOL TARTRATE 25 MG PO TABS
25.0000 mg | ORAL_TABLET | Freq: Two times a day (BID) | ORAL | Status: DC
  Administered 2016-05-09 – 2016-05-10 (×3): 25 mg via ORAL
  Filled 2016-05-09 (×3): qty 1

## 2016-05-09 MED ORDER — SODIUM CHLORIDE 0.9% FLUSH: 10.0000 mL | INTRAVENOUS | Status: DC | PRN

## 2016-05-09 MED ORDER — SODIUM CHLORIDE 0.9% FLUSH
10.0000 mL | Freq: Two times a day (BID) | INTRAVENOUS | Status: DC
  Administered 2016-05-09 – 2016-05-10 (×3): 10 mL
  Administered 2016-05-10: 20 mL

## 2016-05-09 MED ORDER — POTASSIUM PHOSPHATES 15 MMOLE/5ML IV SOLN
30.0000 mmol | Freq: Once | INTRAVENOUS | Status: AC
  Administered 2016-05-09: 30 mmol via INTRAVENOUS
  Filled 2016-05-09: qty 10

## 2016-05-09 MED ORDER — LABETALOL HCL 5 MG/ML IV SOLN
10.0000 mg | INTRAVENOUS | Status: DC | PRN
  Administered 2016-05-09 – 2016-05-11 (×8): 10 mg via INTRAVENOUS
  Filled 2016-05-09 (×6): qty 4

## 2016-05-09 MED ORDER — FUROSEMIDE 40 MG PO TABS
40.0000 mg | ORAL_TABLET | Freq: Two times a day (BID) | ORAL | Status: DC
  Administered 2016-05-09 – 2016-05-10 (×3): 40 mg via ORAL
  Filled 2016-05-09 (×3): qty 1

## 2016-05-09 MED ORDER — SODIUM CHLORIDE 3 % IV SOLN
INTRAVENOUS | Status: DC
  Administered 2016-05-09 – 2016-05-10 (×3): 50 mL/h via INTRAVENOUS
  Filled 2016-05-09 (×7): qty 500

## 2016-05-09 MED ORDER — HEPARIN SODIUM (PORCINE) 5000 UNIT/ML IJ SOLN
5000.0000 [IU] | Freq: Three times a day (TID) | INTRAMUSCULAR | Status: DC
  Administered 2016-05-09 – 2016-05-11 (×7): 5000 [IU] via SUBCUTANEOUS
  Filled 2016-05-09 (×7): qty 1

## 2016-05-09 NOTE — Progress Notes (Signed)
  Echocardiogram 2D Echocardiogram has been performed.  Donald SavoyCasey N Andreah Robertson 05/09/2016, 3:48 PM

## 2016-05-09 NOTE — Progress Notes (Signed)
Daily Progress Note   Patient Name: [name]       Date: 05/09/2016 DOB: [birth year]  Age: 65 y.o. MRN#: [id number] Attending Physician: Brand Males, MD Primary Care Physician: Birdie Riddle, MD Admit Date: 05/06/2016  Reason for Consultation/Follow-up: Establishing goals of care  Subjective: Met with wife at bedside. She and patient have elected full DNR status. Patient wants to be extubated, and did not want to intubated in the first place. She again details no trach, no PEG as patient would not desire these interventions. Patient weaning with pressure support from vent. Minimally sedated with prn fentanyl. Patient opens eyes briefly with verbal stimulation. Moves R arm. Per nursing was able to give thumbs up sign and two fingers this am, but now unable to completely follow commands. Critical care NP at bedside explaining to wife that although patient is breathing on his own without the ventilator, there is concern for his mental status and his ability to maintain his own airway once extubated.   Review of Systems  Unable to perform ROS: Intubated    Length of Stay: 3  Current Medications: Scheduled Meds:  . amiodarone  200 mg Oral Daily  . ampicillin-sulbactam (UNASYN) IV  3 g Intravenous Q8H  . chlorhexidine gluconate (MEDLINE KIT)  15 mL Mouth Rinse BID  . famotidine  20 mg Per Tube Q12H  . furosemide  40 mg Oral BID  . heparin subcutaneous  5,000 Units Subcutaneous Q8H  . insulin aspart  0-15 Units Subcutaneous Q4H  . levothyroxine  25 mcg Per Tube QAC breakfast  . mouth rinse  15 mL Mouth Rinse 10 times per day  . metoprolol  25 mg Oral BID  . potassium phosphate IVPB (mmol)  30 mmol Intravenous Once  . sodium chloride flush  10-40 mL Intracatheter Q12H     Continuous Infusions: . sodium chloride 10 mL/hr at 05/07/16 2236  . feeding supplement (VITAL AF 1.2 CAL) 1,000 mL (05/09/16 0432)  . sodium chloride (hypertonic) 50 mL/hr (05/09/16 1048)    PRN Meds: acetaminophen, fentaNYL (SUBLIMAZE) injection, hydrALAZINE, labetalol, sodium chloride flush  Physical Exam  Constitutional: He appears well-developed and well-nourished.  Cardiovascular: Normal rate and regular rhythm.   Pulmonary/Chest:  endotrachial tube in place, ventilator with pressure support  Abdominal: Soft. Bowel sounds are normal.  Neurological:  Somnolent, opens eyes briefly to loud voices  Skin: Skin is warm and dry.            Vital Signs: BP (!) 137/92   Pulse 88   Temp 97.6 F (36.4 C) (Oral)   Resp 13   Ht 5\' 9"  (1.753 m)   Wt 80.3 kg (177 lb 0.5 oz)   SpO2 96%   BMI 26.14 kg/m  SpO2: SpO2: 96 % O2 Device: O2 Device: Ventilator O2 Flow Rate:    Intake/output summary:  Intake/Output Summary (Last 24 hours) at 05/09/16 1125 Last data filed at 05/09/16 1100  Gross per 24 hour  Intake             2140 ml  Output             2000 ml  Net              140 ml   LBM: Last BM Date: 05/06/16 Baseline Weight: Weight: 79.4 kg (175 lb 0.7 oz) Most recent weight: Weight: 80.3 kg (177 lb 0.5 oz)       Palliative Assessment/Data: PPS: 10%    Flowsheet Rows   Flowsheet Row Most Recent Value  Intake Tab  Referral Department  Neurology  Unit at Time of Referral  Cardiac/Telemetry Unit  Palliative Care Primary Diagnosis  Neurology  Date Notified  05/07/16  Palliative Care Type  New Palliative care  Reason for referral  Clarify Goals of Care  Date of Admission  05/06/16  Date first seen by Palliative Care  05/08/16  # of days Palliative referral response time  1 Day(s)  # of days IP prior to Palliative referral  1  Clinical Assessment  Palliative Performance Scale Score  30%  Pain Max last 24 hours  Not able to report  Pain Min Last 24 hours  Not  able to report  Dyspnea Max Last 24 Hours  Not able to report  Dyspnea Min Last 24 hours  Not able to report  Nausea Max Last 24 Hours  Not able to report  Nausea Min Last 24 Hours  Not able to report  Anxiety Max Last 24 Hours  Not able to report  Anxiety Min Last 24 Hours  Not able to report  Other Max Last 24 Hours  Not able to report  Psychosocial & Spiritual Assessment  Palliative Care Outcomes  Patient/Family meeting held?  Yes  Who was at the meeting?  wife  Palliative Care Outcomes  Clarified goals of care, Provided psychosocial or spiritual support  Patient/Family wishes: Interventions discontinued/not started   Mechanical Ventilation      Patient Active Problem List   Diagnosis Date Noted  . Palliative care encounter   . Cerebral edema (HCC)   . Acute respiratory failure with hypoxia (HCC) 05/07/2016  . CVA (cerebral vascular accident) (HCC) 05/06/2016    Palliative Care Assessment & Plan   Patient Profile: 66 y.o. male  with past medical history of Stroke, intracranial hemorrhage, chronic kidney disease, atrial fib, hypertension, congestive heart failure, hyperlipidemia, admitted on 05/06/2016 [facility] with signs and symptoms of new stroke. He was transferred to [facility] and admitted on 05/06/2016. Initial CT scan showed evolving right MCA stroke with mass effect, without midline shift. CT scan today shows worsening of infarct with increasing edema and mass effect, petechial hemorrhage. Followup CT scan is scheduled for tomorrow. He is intubated but is now off of sedation and is beginning to wean. He arouses minimally to  voices, per wife report he was able to give thumbs up sign this morning and hold up two fingers. On exam he does move his R arm when commanded, and wiggles fingers when asked to hold up two fingers.   Assessment/Recommendations/Plan   Code status: DNR  Continue current level of care with plans to meet with all family once they  arrive  Consider compassionate extubation  Goals of Care and Additional Recommendations:  Limitations on Scope of Treatment: No Artificial Feeding and No Tracheostomy  Code Status:  DNR  Prognosis:   Unable to determine , will be further determined when decisions are made re: extubation.  Discharge Planning:  To Be Determined   Care plan was discussed with patient's wife.  Thank you for allowing the Palliative Medicine Team to assist in the care of this patient.   Time In: 1030 Time Out: 1115 Total Time 45 mins Prolonged Time Billed No      Greater than 50%  of this time was spent counseling and coordinating care related to the above assessment and plan.  Mariana Kaufman, AGNP-C Palliative Medicine   Please contact Palliative Medicine Team phone at [phone] for questions and concerns.

## 2016-05-09 NOTE — Progress Notes (Signed)
   05/09/16 1321  Clinical Encounter Type  Visited With Patient and family together  Visit Type Spiritual support;Initial  Referral From Family  Consult/Referral To Chaplain  Recommendations followup  Spiritual Encounters  Spiritual Needs Emotional  Stress Factors  Patient Stress Factors Health changes  Family Stress Factors Health changes;Major life changes  Pt asleep, spoke with Wife, ministry of presence, emotional support, reassurance, spiritual care information, will follow-up.  [facility] IncChaplain Jamesen Stahnke [phone]

## 2016-05-09 NOTE — Progress Notes (Signed)
**  Preliminary report by tech**  Carotid artery duplex completed. Technically difficult study due to patient anatomy, patient positioning, depth of vessels, immobility, acoustic shadowing, surgical dressings, and hardware. Findings are consistent with a 1-39 percent stenosis involving the right internal carotid artery and the left internal carotid artery. The vertebral arteries demonstrate antegrade flow.  05/09/16 5:54 PM Olen CordialGreg Corry Storie RVT

## 2016-05-09 NOTE — Progress Notes (Signed)
Nutrition Follow-up  DOCUMENTATION CODES:   Severe malnutrition in context of chronic illness  INTERVENTION:   Continue:  Vital AF 1.2 @ 65 ml/hr (1560 ml/day) Provides: 1872 kcal, 117 grams protein, and 1265 ml H2O.  Electrolyte supplementation   NUTRITION DIAGNOSIS:   Malnutrition (Severe) related to chronic illness (CKD) as evidenced by NPO status, severe depletion of body fat, severe depletion of muscle mass, energy intake < or equal to 75% for > or equal to 1 month. Ongoing.   GOAL:   Patient will meet greater than or equal to 90% of their needs Met.   MONITOR:   TF tolerance, Skin, I & O's, Labs  REASON FOR ASSESSMENT:   Consult Enteral/tube feeding initiation and management  ASSESSMENT:   Pt with hx of previous strokes and left sided weakness was admitted from [facility] with evolving large R MCA infarct no tPA or IR intervention.   Spoke with wife at bedside.  Per notes family considering one-way extubation.   Patient is currently intubated on ventilator support MV: 10.1 L/min Temp (24hrs), Avg:99.3 F (37.4 C), Min:97.6 F (36.4 C), Max:101.5 F (38.6 C)  Medications reviewed and include: Kphos, 3%, lasix, novolog Labs reviewed: Na 151, PO4 1.9, K+ and magnesium are WNL CBG's: 117-136 PO4 low prior to start of TF and has remained low, being supplemented   Diet Order:  Diet NPO time specified  Skin:  Wound (see comment) (bilateral lower extremity ulcers)  Last BM:  10/20  Height:   Ht Readings from Last 1 Encounters:  05/06/16 '5\' 9"'$  (1.753 m)    Weight:   Wt Readings from Last 1 Encounters:  05/09/16 177 lb 0.5 oz (80.3 kg)    Ideal Body Weight:  72.7 kg  BMI:  Body mass index is 26.14 kg/m.  Estimated Nutritional Needs:   Kcal:  1885  Protein:  115-125 grams  Fluid:  > 1.8 L/day  EDUCATION NEEDS:   No education needs identified at this time  [place], [place], [place] Pager [phone] After Hours Pager

## 2016-05-09 NOTE — Consult Note (Signed)
[facility] Nurse wound consult note Reason for Consult:  LE wounds Patient followed by wound care center per chart review Wound type: venous stasis Pressure Ulcer POA: No Measurement: right pretibial: 6cm x 3cm x 0.1cm; left pretibial distal 1cm x 0.5cm x 0.1cm/ left proximal pretibial 0.5cm x 0.5cm x 0.1cm Wound bed: all are clean and 100% beefy red Drainage (amount, consistency, odor) minimal, serous Periwound: intact, re-epithelialization noted on the right pretibial wound, large area that appears was previously open not closed Dressing procedure/placement/frequency: Soft silicone foam to protect and insulate, absorb drainage. Change every 3 days.  Met with wife at the bedside confirmed patient does have compression stockings but is not compliant with wearing them, she understands importance of this therapy.    Discussed POC with patient and bedside nurse.  Re consult if needed, will not follow at this time. Thanks  Pankaj Haack [employer], RN,CNS, [place] [phone])

## 2016-05-09 NOTE — Progress Notes (Signed)
STROKE TEAM PROGRESS NOTE   SUBJECTIVE (INTERVAL HISTORY) His wife is at bedside. Pt received labetalol twice over night for high BP. However, he is awake and follows commands. At baseline, as per wife, LUE no movement but able to walk without assistance although dragging on the left. Na 151. Will d/c mannitol. Continue 3% saline, resume BP home meds. And consult wound care  OBJECTIVE Temp:  [97.6 F (36.4 C)-101.5 F (38.6 C)] 97.6 F (36.4 C) (10/23 0745) Pulse Rate:  [71-108] 95 (10/23 0800) Cardiac Rhythm: Normal sinus rhythm (10/23 0753) Resp:  [11-38] 25 (10/23 0800) BP: (130-184)/(85-118) 169/108 (10/23 0800) SpO2:  [93 %-100 %] 99 % (10/23 0745) FiO2 (%):  [30 %] 30 % (10/23 0745) Weight:  [177 lb 0.5 oz (80.3 kg)] 177 lb 0.5 oz (80.3 kg) (10/23 0500)  CBC:   Recent Labs Lab 05/08/16 0315 05/09/16 0415  WBC 5.8 8.4  HGB 13.6 14.4  HCT 40.6 42.4  MCV 80.9 81.2  PLT 147* 161    Basic Metabolic Panel:  Recent Labs Lab 05/08/16 0315  05/08/16 1730  05/09/16 0035 05/09/16 0415  NA 154*  < >  --   < > 150* 151*  K 3.5  --   --   --   --  3.5  CL 126*  --   --   --   --  121*  CO2 23  --   --   --   --  22  GLUCOSE 139*  --   --   --   --  144*  BUN 17  --   --   --   --  17  CREATININE 1.79*  --   --   --   --  1.43*  CALCIUM 7.9*  --   --   --   --  8.0*  MG 2.0  --  2.1  --   --  1.9  PHOS 1.9*  --  2.3*  --   --  1.9*  < > = values in this interval not displayed.  Lipid Panel:     Component Value Date/Time   CHOL 117 05/07/2016 0430   TRIG 83 05/09/2016 0415   HDL 26 (L) 05/07/2016 0430   CHOLHDL 4.5 05/07/2016 0430   VLDL 15 05/07/2016 0430   LDLCALC 76 05/07/2016 0430   HgbA1c:  Lab Results  Component Value Date   HGBA1C 4.5 (L) 05/07/2016   Urine Drug Screen: No results found for: LABOPIA, COCAINSCRNUR, LABBENZ, AMPHETMU, THCU, LABBARB    IMAGING I have personally reviewed the radiological images below and agree with the radiology  interpretations.  Ct Cerebral Perfusion W Contrast 05/06/2016   1. CT Brain Perfusion analysis significantly confounded by right MCA territory parenchymal staining from the 0330 hours CTA today.  2. Large area of infarct core indicated on the basis of plain CT (current ASPECTS 0-3) is not detected on this perfusion study. 3. Furthermore, the CT Perfusion source images suggest there has been some interval Re-cannulation of the right MCA M1 since 0330 hours.   Ct Head Code Stroke Wo Contrast 05/06/2016 1. Evolving large right MCA infarct, with areas of parenchymal contrast staining from the CTA performed at 0330 hours today. Mild mass effect on the right lateral ventricle without midline shift. No hemorrhagic transformation suspected.  2. ASPECTS is at best 3 and at worst 0.   CT Head Wo Contrast 05/08/2016 IMPRESSION: Evolution of large right middle cerebral artery distribution infarct now  with significant swelling/ edema, mass effect and peripheral petechial hemorrhage. Hemorrhage within the right lenticular nucleus/ corona radiata. Mass effect upon the right lateral ventricle with 4.3 mm of midline shift to the left.   Dg Chest Port 1 View 05/09/2016 IMPRESSION: 1. Lines and tubes in stable position. 2. Congestive heart failure with bilateral pulmonary interstitial edema and small left pleural effusion. Similar findings noted on prior exam. Low lung volumes.   PHYSICAL EXAM  Temp:  [97.6 F (36.4 C)-101.5 F (38.6 C)] 97.6 F (36.4 C) (10/23 0745) Pulse Rate:  [71-108] 95 (10/23 0800) Resp:  [11-38] 25 (10/23 0800) BP: (130-184)/(85-118) 169/108 (10/23 0800) SpO2:  [93 %-100 %] 99 % (10/23 0745) FiO2 (%):  [30 %] 30 % (10/23 0745) Weight:  [177 lb 0.5 oz (80.3 kg)] 177 lb 0.5 oz (80.3 kg) (10/23 0500)  General - Well nourished, well developed, intubated.  Ophthalmologic - Fundi not visualized due to noncooperation.  Cardiovascular - Regular rate and rhythm.  Neuro - intubated  and off sedation, awake alert, eyes open and following simple commands both midline and peripheral commands on the right UE and LE. PERRL, blinking to visual threat bilaterally, eyes right gaze preference and barely cross midline to the left. Facial symmetry difficulty to access due to ET tube. Tongue in midline. Moving RUE 5/5, RLE proximal 3/5 and distal 5/5. LUE 0/5 with increased tone, LLE trace withdraw to pain stimulation. RUE and RLE 3+ DTR and right babinski positive. Sensation, coordination and gait not tested.    ASSESSMENT/PLAN [name] is a 65 y.o. male with history of CHF, HTN, HLD, hx of ischemic strokes and hx of right BG ICH in 2009 while on plavix, CKD, CAD, afib on ASA presenting with left sided weakness and aphasia. He did not receive IV t-PA due to late presentation.  Right MCA infarct with hemorrhagic transformation likely secondary to atrial fibrillation not on AC.  Resultant  Left sided weakness, intubated  MRI - not performed yet.  MRA - not performed yet  CTP - right MCA M1 occlusion  CT - Evolving large right MCA infarct with hemorrhage transformation and midline shift  Repeat CT - large right MCA infarct with hemorrhage transformation and midline shift  Carotid Doppler - pending  2D Echo - pending  LDL - 76  HgbA1c 4.5  VTE prophylaxis - subcutaneous Heparin / SCDs Diet NPO time specified  aspirin 81 mg daily prior to admission, now on no antithrombotics due to hemorrhagic transformation  Ongoing aggressive stroke risk factor management  Therapy recommendations:  pending  Disposition: pending  Cerebral edema  CT showed midline shift  On 3% saline   Na goal 150-155  D/c mannitol   Repeat CT in am  History of stroke  2008 head CT showed multifocal remote ischemic strokes  2009 head CT showed right BG ICH - left sided residue - on plavix at that time  afib not on [facility]  Has previous ischemic stroke evidenced by CT head in  2008  Had ICH in 2009 while on plavix  Not on coumadin in the past  Not on antithrombotics now due to hemorrhagic transformation  Resume metoprolol home dose  May consider anticoagulation with DOAC in the future if pt condition allows.  CHF  TTE pending  Resume lasix 40mg  bid  Resume metoprolol 25mg  bid  Cardiology on board  Hypertension  Stable BP goal < 160 due to hemorrhagic transformation Resume lasix and metoprolol  Hyperlipidemia  Home  meds: none  LDL - 76, goal < 70  Hold off statin now due to hemorrhage  Consider low dose statin on discharge.  Other Stroke Risk Factors  Advanced age  Former cigarette smoker - quit in the past  Hx stroke/TIA  Coronary artery disease  Atrial Fibrillation - not anticoagulated  Other Active Problems  Hypernatremia - Na - 154 -> 151  CKD - Cre 1.79->1.43  Hypokalemia - 3.2 -> 3.5   Hospital day # 3  This patient is critically ill due to large right MCA infarct with hemorrhagic transformation, afib not on AC, CHF, HTN and at significant risk of neurological worsening, death form recurrent stroke, hemorrhage extension, cerebral edema, brain herniation, heart failure. This patient's care requires constant monitoring of vital signs, hemodynamics, respiratory and cardiac monitoring, review of multiple databases, neurological assessment, discussion with family, other specialists and medical decision making of high complexity. I spent 40 minutes of neurocritical care time in the care of this patient.  Marvel Plan, MD PhD Stroke Neurology 05/09/2016 9:18 AM

## 2016-05-09 NOTE — Progress Notes (Signed)
PULMONARY / CRITICAL CARE MEDICINE   Name: [name] MRN: [id number] DOB: May 28, 1951    ADMISSION DATE:  05/06/2016  CHIEF COMPLAINT:  Stroke  BRIEF: 65 yo male from [facility] hospital 10/20 after presenting with Lt sided weakness.  He had previous stroke with Lt sided weakness.  CT head showed large vessel occlusion of proximal Rt M1 segment.  There was concern for flash pulmonary edema and he was intubated.  He was then transferred to [facility] for further management.  SUBJECTIVE:  Afebrile. Remains on 3% saline.   VITAL SIGNS: BP (!) 171/105   Pulse 99   Temp 97.6 F (36.4 C) (Oral)   Resp (!) 28   Ht 5\' 9"  (1.753 m)   Wt 80.3 kg (177 lb 0.5 oz)   SpO2 97%   BMI 26.14 kg/m   HEMODYNAMICS: CVP:  [6 mmHg-7 mmHg] 6 mmHg  VENTILATOR SETTINGS: Vent Mode: PRVC FiO2 (%):  [30 %] 30 % Set Rate:  [16 bmp] 16 bmp Vt Set:  [550 mL] 550 mL PEEP:  [5 cmH20] 5 cmH20 Plateau Pressure:  [8 cmH20-20 cmH20] 18 cmH20  INTAKE / OUTPUT: I/O last 3 completed shifts: In: 3523 [I.V.:572.6; Other:120; NG/GT:2173.8; IV Piggyback:656.7] Out: 3300 [Urine:2300; Emesis/NG output:1000]  PHYSICAL EXAMINATION: General:  Chronically ill appearing male in NAD Neuro: moves Rt sided, following simple commands but drifts back off quickly, no movement on L HEENT: Pupils reactive, Tracks, ETT in place Cardiovascular:  Regular, no murmur Lungs: non-labored on PS 8/5, lungs bilaterally coarse with few rhonchi Abdomen:  Soft, non tender, decreased BS Musculoskeletal:  No acute deformities Skin: venous stasis changes, warm/dry, mild edema   LABS:  BMET  Recent Labs Lab 05/07/16 0430  05/08/16 0315  05/08/16 2030 05/09/16 0035 05/09/16 0415  NA 147*  146*  < > 154*  < > 150* 150* 151*  K 3.2*  --  3.5  --   --   --  3.5  CL 116*  --  126*  --   --   --  121*  CO2 22  --  23  --   --   --  22  BUN 14  --  17  --   --   --  17  CREATININE 1.80*  --  1.79*  --   --   --  1.43*  GLUCOSE  105*  --  139*  --   --   --  144*  < > = values in this interval not displayed.  Electrolytes  Recent Labs Lab 05/07/16 0430  05/08/16 0315 05/08/16 1730 05/09/16 0415  CALCIUM 7.6*  --  7.9*  --  8.0*  MG  --   < > 2.0 2.1 1.9  PHOS  --   < > 1.9* 2.3* 1.9*  < > = values in this interval not displayed.  CBC  Recent Labs Lab 05/07/16 0430 05/08/16 0315 05/09/16 0415  WBC 6.0 5.8 8.4  HGB 13.9 13.6 14.4  HCT 40.2 40.6 42.4  PLT 148* 147* 161    Coag's  Recent Labs Lab 05/06/16 0955 05/07/16 0430  APTT 39*  --   INR  --  1.41    Sepsis Markers  Recent Labs Lab 05/07/16 1510 05/08/16 0315 05/09/16 0415  PROCALCITON 0.40 0.34 0.32    ABG  Recent Labs Lab 05/06/16 0708 05/06/16 1011  PHART 7.307* 7.434  PCO2ART 51.7* 34.5  PO2ART 228.0* 60.0*    Liver Enzymes  Recent Labs Lab 05/06/16  0830  AST 50*  ALT 20  ALKPHOS 93  BILITOT 1.3*  ALBUMIN 2.9*    Cardiac Enzymes No results for input(s): TROPONINI, PROBNP in the last 168 hours.  Glucose  Recent Labs Lab 05/08/16 1135 05/08/16 1622 05/08/16 2035 05/09/16 0009 05/09/16 0416 05/09/16 0749  GLUCAP 142* 126* 115* 95 136* 117*    Imaging Ct Head Wo Contrast  Result Date: 05/08/2016 CLINICAL DATA:  65 year old male with left-sided weakness. Subsequent encounter. EXAM: CT HEAD WITHOUT CONTRAST TECHNIQUE: Contiguous axial images were obtained from the base of the skull through the vertex without intravenous contrast. COMPARISON:  CT profusion study and head CT 05/06/2016. FINDINGS: Brain: Evolution of large right middle cerebral artery distribution infarct now with significant swelling/ edema, mass effect and peripheral petechial hemorrhage. Hemorrhage within the right lenticular nucleus/ corona radiata. Mass effect upon the right lateral ventricle with 4.3 mm of midline shift to the left. No trapping of the left lateral ventricle currently. Remote left posterior frontal -parietal lobe  infarct. No intracranial mass lesion noted on this unenhanced exam. Vascular: Vascular calcifications. Hyperdense right middle cerebral artery consistent with thrombosis. Skull: No acute abnormality. Sinuses/Orbits: Mild exophthalmos. Visualized sinuses and mastoid air cells are clear. Other: Negative. IMPRESSION: Evolution of large right middle cerebral artery distribution infarct now with significant swelling/ edema, mass effect and peripheral petechial hemorrhage. Hemorrhage within the right lenticular nucleus/ corona radiata. Mass effect upon the right lateral ventricle with 4.3 mm of midline shift to the left. These results were called by telephone at the time of interpretation on 05/08/2016 at 12:33 pm to [facility] patient's nurse who verbally acknowledged these results. Electronically Signed   By: Lacy Duverney M.D.   On: 05/08/2016 12:38   Dg Chest Port 1 View  Result Date: 05/09/2016 CLINICAL DATA:  Respiratory failure. EXAM: PORTABLE CHEST 1 VIEW COMPARISON:  05/08/2016 . FINDINGS: Endotracheal tube, NG tube, left IJ line in stable position. Cardiomegaly with bilateral pulmonary interstitial prominence again noted. Findings consistent with congestive heart failure. The similar findings noted on prior exam. Low lung volumes. Small left pleural effusion. IMPRESSION: 1. Lines and tubes in stable position. 2. Congestive heart failure with bilateral pulmonary interstitial edema and small left pleural effusion. Similar findings noted on prior exam. Low lung volumes. Electronically Signed   By: Maisie Fus  Register   On: 05/09/2016 07:15    STUDIES:  CT head 10/20 >> large vessel occlusion of proximal Rt M1 segment.   ECHO 10/21 >>  CT head 10/22>>> evolution of large R MCA stroke, now with significant swelling/ edema, mass effect and peripheral petechial hemorrhage. Hemorrhage within the right lenticular nucleus/ corona radiata. Mass effect upon the right lateral ventricle with 4.3 mm of midline shift to  the left.  CULTURES:  ANTIBIOTICS:  SIGNIFICANT EVENTS: 10/20  transfer from [place], neuro consulted 10/21  Waking, following commands  LINES/TUBES: ETT 10/20 >>  L IJ 10/20 >>   DISCUSSION: 65 y/o male presented to [facility] ER after collapsing at home and had worsening Lt sided weakness.  CT head showed proximal Rt M1 occlusion.  He was outside window for tPA.  He developed acute hypoxic respiratory failure from pulmonary edema and required intubation.  ASSESSMENT / PLAN:  NEUROLOGIC A:   Acute CVA with hx of prior CVA and Rt thalamic ICH - evolving infarct with hemorrhagic transformation and midline shift.  P:   RASS goal: 0 Repeat CT am 10/24 3% NS infusion - Na goal 150-155 Minimize sedation as able  PRN fentanyl for pain  Off mannitol   PULMONARY A: Acute hypoxic respiratory failure from acute pulmonary edema. P:   PRVC 8 cc/kg  PS wean as tol - mental status does not allow for extubation at this point - see discussion below  Wean PEEP/FiO2 for sats > 90% SBT / WUA F/u CXR, ABG See ID  Diuresis as BP and Scr tol   CARDIOVASCULAR A:  Acute pulmonary edema. Hx of HTN AF - not on anticoagulation due to prior ICH . P:  Continue amiodarone Monitor hemodynamics ECHO pending Diuresis as BP and Scr tol   RENAL A:   Hx of CKD. Hypokalemia  Hypophosphatemia  P:   Trend BMP / UOP  Replace electrolytes as indicated  kphos 10/23  GASTROINTESTINAL A:   Nutrition. P:   Begin TF  Pepcid for SUP  HEMATOLOGIC A:   No acute issues. P:  F/u CBC SQ heparin, SCD's for DVT prophylaxis  INFECTIOUS A:   Increased Pulmonary Secretions + Fever - no infiltrate on CXR  P:   Empiric unasyn  Trend PCT, narrow / d/c as able   ENDOCRINE A:   Hx of hypothyroidism.   P:   Continue Synthroid Check HbA1C   Discussed at length with wife at bedside.  Pt now DNR/DNI after discussions with palliative care.  Wife is waiting for pts son to arrive today from  [place].  She thinks that ultimately pt would not want to be intubated at all, even now, and may want to consider one-way extubation but wants to discuss with pts son.  Pt tolerating PS wean but mental status does not yet support extubation.   Dirk DressKaty Whiteheart, NP 05/09/2016  11:12 AM Pager: [phone] or [phone]  STAFF NOTE: I, Rory Percyaniel Danita Proud, MD FACP have personally reviewed patient's available data, including medical history, events of note, physical examination and test results as part of my evaluation. I have discussed with resident/NP and other care providers such as pharmacist, RN and RRT. In addition, I personally evaluated patient and elicited key findings of: becomes awake, FC, min support on vent, should have SBT wean cpap 5 ps5, goal 2 hrs, assess cough and airway protection and RSBI, limit sedation as able, CT reviewed, 3% restarted Na noted, with prior pulm edema we need to be cautious with 3% , follow pcxr in am and O2 needs on vent as well, low threshold to DC this with prior pulm edema, fortunately  pcxr is improved, unasyn narrow agree, I have updated family in full  The patient is critically ill with multiple organ systems failure and requires high complexity decision making for assessment and support, frequent evaluation and titration of therapies, application of advanced monitoring technologies and extensive interpretation of multiple databases.   Critical Care Time devoted to patient care services described in this note is 30 Minutes. This time reflects time of care of this signee: Rory Percyaniel Nancee Brownrigg, MD FACP. This critical care time does not reflect procedure time, or teaching time or supervisory time of PA/NP/Med student/Med Resident etc but could involve care discussion time. Rest per NP/medical resident whose note is outlined above and that I agree with   Mcarthur Rossettianiel J. Tyson AliasFeinstein, MD, FACP Pgr: [phone] [facility] 05/09/2016 1:39 PM

## 2016-05-09 NOTE — Progress Notes (Signed)
eLink Physician-Brief Progress Note Patient Name: Haig ProphetClaude D Ravenscroft DOB: Sep 25, 1950 MRN: [id number]   Date of Service  05/09/2016  HPI/Events of Note  Several loose stools. Request for Flexiseal.   eICU Interventions  Will order: 1. Will place Flexiseal.      Intervention Category Minor Interventions: Routine modifications to care plan (e.g. PRN medications for pain, fever)  Sommer,Steven Eugene 05/09/2016, 5:07 PM

## 2016-05-10 ENCOUNTER — Inpatient Hospital Stay (HOSPITAL_COMMUNITY): Payer: Medicare HMO

## 2016-05-10 ENCOUNTER — Encounter (HOSPITAL_COMMUNITY): Payer: Self-pay | Admitting: *Deleted

## 2016-05-10 DIAGNOSIS — E876 Hypokalemia: Secondary | ICD-10-CM

## 2016-05-10 DIAGNOSIS — Z515 Encounter for palliative care: Secondary | ICD-10-CM

## 2016-05-10 DIAGNOSIS — I610 Nontraumatic intracerebral hemorrhage in hemisphere, subcortical: Secondary | ICD-10-CM

## 2016-05-10 DIAGNOSIS — I480 Paroxysmal atrial fibrillation: Secondary | ICD-10-CM

## 2016-05-10 DIAGNOSIS — Z978 Presence of other specified devices: Secondary | ICD-10-CM

## 2016-05-10 DIAGNOSIS — E870 Hyperosmolality and hypernatremia: Secondary | ICD-10-CM

## 2016-05-10 DIAGNOSIS — Z7189 Other specified counseling: Secondary | ICD-10-CM

## 2016-05-10 DIAGNOSIS — Z8673 Personal history of transient ischemic attack (TIA), and cerebral infarction without residual deficits: Secondary | ICD-10-CM

## 2016-05-10 LAB — GLUCOSE, CAPILLARY
GLUCOSE-CAPILLARY: 116 mg/dL — AB (ref 65–99)
GLUCOSE-CAPILLARY: 122 mg/dL — AB (ref 65–99)
GLUCOSE-CAPILLARY: 87 mg/dL (ref 65–99)
GLUCOSE-CAPILLARY: 99 mg/dL (ref 65–99)
Glucose-Capillary: 120 mg/dL — ABNORMAL HIGH (ref 65–99)
Glucose-Capillary: 75 mg/dL (ref 65–99)

## 2016-05-10 LAB — BASIC METABOLIC PANEL
Anion gap: 7 (ref 5–15)
Anion gap: 7 (ref 5–15)
BUN: 20 mg/dL (ref 6–20)
BUN: 19 mg/dL (ref 6–20)
CALCIUM: 8.4 mg/dL — AB (ref 8.9–10.3)
CHLORIDE: 121 mmol/L — AB (ref 101–111)
CO2: 24 mmol/L (ref 22–32)
CO2: 24 mmol/L (ref 22–32)
CREATININE: 1.53 mg/dL — AB (ref 0.61–1.24)
CREATININE: 1.43 mg/dL — AB (ref 0.61–1.24)
Calcium: 7.7 mg/dL — ABNORMAL LOW (ref 8.9–10.3)
Chloride: 125 mmol/L — ABNORMAL HIGH (ref 101–111)
GFR calc Af Amer: 57 mL/min — ABNORMAL LOW (ref >60)
GFR calc non Af Amer: 46 mL/min — ABNORMAL LOW (ref >60)
GFR, EST AFRICAN AMERICAN: 53 mL/min — AB (ref >60)
GFR, EST NON AFRICAN AMERICAN: 50 mL/min — AB (ref >60)
GLUCOSE: 111 mg/dL — AB (ref 65–99)
Glucose, Bld: 122 mg/dL — ABNORMAL HIGH (ref 65–99)
POTASSIUM: 3.4 mmol/L — AB (ref 3.5–5.1)
Potassium: 4 mmol/L (ref 3.5–5.1)
SODIUM: 152 mmol/L — AB (ref 135–145)
SODIUM: 156 mmol/L — AB (ref 135–145)

## 2016-05-10 LAB — CBC
HEMATOCRIT: 39.6 % (ref 39.0–52.0)
HEMOGLOBIN: 13.3 g/dL (ref 13.0–17.0)
MCH: 27.2 pg (ref 26.0–34.0)
MCHC: 33.6 g/dL (ref 30.0–36.0)
MCV: 81 fL (ref 78.0–100.0)
Platelets: 157 10*3/uL (ref 150–400)
RBC: 4.89 MIL/uL (ref 4.22–5.81)
RDW: 16.8 % — ABNORMAL HIGH (ref 11.5–15.5)
WBC: 6.6 10*3/uL (ref 4.0–10.5)

## 2016-05-10 LAB — VAS US CAROTID
LCCADDIAS: -11 cm/s
LCCAPSYS: 117 cm/s
LEFT VERTEBRAL DIAS: -9 cm/s
Left CCA dist sys: -72 cm/s
Left CCA prox dias: 18 cm/s
Left ICA dist dias: -15 cm/s
Left ICA dist sys: -44 cm/s
Left ICA prox dias: -18 cm/s
Left ICA prox sys: -68 cm/s
RCCADSYS: -34 cm/s
RCCAPSYS: -113 cm/s
RIGHT ECA DIAS: -9 cm/s
RIGHT VERTEBRAL DIAS: -14 cm/s
Right CCA prox dias: -11 cm/s

## 2016-05-10 LAB — PHOSPHORUS: Phosphorus: 3.1 mg/dL (ref 2.5–4.6)

## 2016-05-10 LAB — TROPONIN I
Troponin I: 0.6 ng/mL (ref <0.03)
Troponin I: 0.49 ng/mL (ref <0.03)
Troponin I: 0.42 ng/mL (ref <0.03)

## 2016-05-10 LAB — SODIUM
SODIUM: 153 mmol/L — AB (ref 135–145)
SODIUM: 155 mmol/L — AB (ref 135–145)

## 2016-05-10 LAB — MAGNESIUM: MAGNESIUM: 1.8 mg/dL (ref 1.7–2.4)

## 2016-05-10 MED ORDER — ATROPINE SULFATE 1 % OP SOLN
2.0000 [drp] | Freq: Four times a day (QID) | OPHTHALMIC | Status: DC | PRN
  Administered 2016-05-10: 2 [drp] via SUBLINGUAL
  Filled 2016-05-10: qty 2

## 2016-05-10 MED ORDER — SODIUM CHLORIDE 3 % IV SOLN
INTRAVENOUS | Status: DC
  Filled 2016-05-10 (×2): qty 500

## 2016-05-10 MED ORDER — DEXTROSE 5 % IV SOLN
2.0000 g | INTRAVENOUS | Status: DC
  Administered 2016-05-10 – 2016-05-11 (×2): 2 g via INTRAVENOUS
  Filled 2016-05-10 (×2): qty 2

## 2016-05-10 MED ORDER — MAGNESIUM SULFATE IN D5W 1-5 GM/100ML-% IV SOLN
1.0000 g | Freq: Once | INTRAVENOUS | Status: AC
  Administered 2016-05-10: 1 g via INTRAVENOUS
  Filled 2016-05-10: qty 100

## 2016-05-10 MED ORDER — ORAL CARE MOUTH RINSE
15.0000 mL | Freq: Two times a day (BID) | OROMUCOSAL | Status: DC
  Administered 2016-05-10 – 2016-05-11 (×2): 15 mL via OROMUCOSAL

## 2016-05-10 MED ORDER — MORPHINE SULFATE (PF) 2 MG/ML IV SOLN
1.0000 mg | INTRAVENOUS | Status: DC | PRN
  Administered 2016-05-11 (×2): 2 mg via INTRAVENOUS
  Filled 2016-05-10: qty 2
  Filled 2016-05-10 (×2): qty 1

## 2016-05-10 MED ORDER — POTASSIUM CHLORIDE 20 MEQ/15ML (10%) PO SOLN
40.0000 meq | Freq: Once | ORAL | Status: AC
  Administered 2016-05-10: 40 meq
  Filled 2016-05-10: qty 30

## 2016-05-10 NOTE — Progress Notes (Addendum)
eLink Physician-Brief Progress Note Patient Name: [name] DOB: March 27, 1951 MRN: [id number]   Date of Service  05/10/2016  HPI/Events of Note  NSVT K 3.4  eICU Interventions  40 meq KCL via tube Check Mg, phos, EKG, troponin     Intervention Category Evaluation Type: Other  [name] 05/10/2016, 6:09 AM

## 2016-05-10 NOTE — Care Management Note (Signed)
Case Management Note  Patient Details  Name: [name] MRN: [id number] Date of Birth: 1951/02/24  Subjective/Objective:      Adm w cva              Action/Plan:lives w wife   Expected Discharge Date:                  Expected Discharge Plan:  Home w Hospice Care  In-House Referral:     Discharge planning Services  CM Consult  Post Acute Care Choice:    Choice offered to:  Spouse  DME Arranged:    DME Agency:     HH Arranged:  RN, Social Work [employer] [facility] [place] [place]  Status of Service:  In process, will continue to follow  If discussed at Long Length of Stay Meetings, dates discussed:    Additional Comments:gave wife hsopice agency list. [facility] [place] Spoke w Olegario Messierkathy there and faxed over orders. Cont to follow.  Hanley Haysowell, Jeriyah Granlund T, RN 05/10/2016, 10:19 AM

## 2016-05-10 NOTE — Procedures (Signed)
Extubation Procedure Note  Patient Details:   Name: [name] DOB: 1950/08/24 MRN: [id number]   Airway Documentation:     Evaluation  O2 sats: stable throughout Complications: No apparent complications Patient did tolerate procedure well. Bilateral Breath Sounds: Rhonchi, Diminished   Yes   Pt extubated to 3L [place] per MD order. Pt stable throughout with no complications. Pt with very weak cough, but copious amounts of clear thick oral secretions. Pt able to speak his name softly. Pt is transitioning to comfort care. RT will continue to monitor.   Ermalinda BarriosKelley, Cindel Daugherty M 05/10/2016, 10:17 AM

## 2016-05-10 NOTE — Progress Notes (Signed)
STROKE TEAM PROGRESS NOTE   SUBJECTIVE (INTERVAL HISTORY) His wife and son are at bedside. Continue 3% saline, and Na 153 this am, on target. Still intubated and plan for one way extubation today.   OBJECTIVE Temp:  [98 F (36.7 C)-99.6 F (37.6 C)] 99.6 F (37.6 C) (10/24 1145) Pulse Rate:  [76-100] 86 (10/24 1300) Cardiac Rhythm: Normal sinus rhythm (10/24 0800) Resp:  [13-32] 21 (10/24 1300) BP: (123-166)/(78-145) 156/97 (10/24 1300) SpO2:  [97 %-100 %] 100 % (10/24 1300) FiO2 (%):  [30 %-40 %] 30 % (10/24 0800) Weight:  [174 lb 6.1 oz (79.1 kg)] 174 lb 6.1 oz (79.1 kg) (10/24 0500)  CBC:   Recent Labs Lab 05/09/16 0415 05/10/16 0205  WBC 8.4 6.6  HGB 14.4 13.3  HCT 42.4 39.6  MCV 81.2 81.0  PLT 161 157    Basic Metabolic Panel:  Recent Labs Lab 05/09/16 0415  05/10/16 0205 05/10/16 0705 05/10/16 1258  NA 151*  < > 152* 153* 155*  K 3.5  --  3.4*  --   --   CL 121*  --  121*  --   --   CO2 22  --  24  --   --   GLUCOSE 144*  --  122*  --   --   BUN 17  --  20  --   --   CREATININE 1.43*  --  1.53*  --   --   CALCIUM 8.0*  --  7.7*  --   --   MG 1.9  --   --  1.8  --   PHOS 1.9*  --   --  3.1  --   < > = values in this interval not displayed.  Lipid Panel:     Component Value Date/Time   CHOL 117 05/07/2016 0430   TRIG 83 05/09/2016 0415   HDL 26 (L) 05/07/2016 0430   CHOLHDL 4.5 05/07/2016 0430   VLDL 15 05/07/2016 0430   LDLCALC 76 05/07/2016 0430   HgbA1c:  Lab Results  Component Value Date   HGBA1C 4.5 (L) 05/07/2016   Urine Drug Screen: No results found for: LABOPIA, COCAINSCRNUR, LABBENZ, AMPHETMU, THCU, LABBARB    IMAGING I have personally reviewed the radiological images below and agree with the radiology interpretations.  Ct Cerebral Perfusion W Contrast 05/06/2016   1. CT Brain Perfusion analysis significantly confounded by right MCA territory parenchymal staining from the 0330 hours CTA today.  2. Large area of infarct core  indicated on the basis of plain CT (current ASPECTS 0-3) is not detected on this perfusion study. 3. Furthermore, the CT Perfusion source images suggest there has been some interval Re-cannulation of the right MCA M1 since 0330 hours.   Ct Head Code Stroke Wo Contrast 05/06/2016 1. Evolving large right MCA infarct, with areas of parenchymal contrast staining from the CTA performed at 0330 hours today. Mild mass effect on the right lateral ventricle without midline shift. No hemorrhagic transformation suspected.  2. ASPECTS is at best 3 and at worst 0.   CT Head Wo Contrast 05/08/2016 IMPRESSION: Evolution of large right middle cerebral artery distribution infarct now with significant swelling/ edema, mass effect and peripheral petechial hemorrhage. Hemorrhage within the right lenticular nucleus/ corona radiata. Mass effect upon the right lateral ventricle with 4.3 mm of midline shift to the left.   Dg Chest Port 1 View 05/09/2016 IMPRESSION: 1. Lines and tubes in stable position. 2. Congestive heart failure with bilateral pulmonary  interstitial edema and small left pleural effusion. Similar findings noted on prior exam. Low lung volumes.  CUS - Findings are consistent with a 1-39 percent stenosis involving the right internal carotid artery and the left internal carotid artery. The vertebral arteries demonstrate antegrade flow.  TTE pending  Ct Head Wo Contrast 05/10/2016 IMPRESSION: Evolving large RIGHT MCA territory infarct with similar focal hemorrhagic conversion. Worsening edema, resulting in 7 mm RIGHT to LEFT midline shift. No LEFT ventricle entrapment.    PHYSICAL EXAM  Temp:  [98 F (36.7 C)-99.6 F (37.6 C)] 99.6 F (37.6 C) (10/24 1145) Pulse Rate:  [76-100] 86 (10/24 1300) Resp:  [13-32] 21 (10/24 1300) BP: (123-166)/(78-145) 156/97 (10/24 1300) SpO2:  [97 %-100 %] 100 % (10/24 1300) FiO2 (%):  [30 %-40 %] 30 % (10/24 0800) Weight:  [174 lb 6.1 oz (79.1 kg)] 174 lb 6.1  oz (79.1 kg) (10/24 0500)  General - Well nourished, well developed, intubated.  Ophthalmologic - Fundi not visualized due to noncooperation.  Cardiovascular - Regular rate and rhythm.  Neuro - intubated and on  sedation, sleepy, but eyes open on voice and following some simple commands both midline and peripheral commands on the right UE and LE. PERRL, blinking to visual threat bilaterally, eyes right gaze preference and barely cross midline to the left. Facial symmetry difficulty to access due to ET tube. Tongue in midline. Moving RUE 5/5, RLE proximal 3/5 and distal 5/5. LUE 0/5 with increased tone, LLE trace withdraw to pain stimulation. RUE and RLE 3+ DTR and right babinski positive. Sensation, coordination and gait not tested.    ASSESSMENT/PLAN [name] is a 65 y.o. male with history of CHF, HTN, HLD, hx of ischemic strokes and hx of right BG ICH in 2009 while on plavix, CKD, CAD, afib on ASA presenting with left sided weakness and aphasia. He did not receive IV t-PA due to late presentation.  Right MCA infarct with hemorrhagic transformation likely secondary to atrial fibrillation not on AC.  Resultant  Left sided weakness, intubated  MRI - not performed yet.  MRA - not performed yet  CTP - right MCA M1 occlusion  CT - Evolving large right MCA infarct with hemorrhage transformation and midline shift  Repeat CT - large right MCA infarct with hemorrhage transformation and midline shift  Carotid Doppler - unremarkable  2D Echo - pending  LDL - 76  HgbA1c 4.5  VTE prophylaxis - subcutaneous Heparin / SCDs Diet NPO time specified  aspirin 81 mg daily prior to admission, now on no antithrombotics due to hemorrhagic transformation  Ongoing aggressive stroke risk factor management  Therapy recommendations:  pending  Disposition: pending  Cerebral edema  CT showed midline shift  On 3% saline   Na goal 150-155  Na 151->153->155  D/c mannitol    Repeat CT no significant change, still has RTL midline shift but stable  History of stroke  2008 head CT showed multifocal remote ischemic strokes  2009 head CT showed right BG ICH - left sided residue - on plavix at that time  afib not on [facility]  Has previous ischemic stroke evidenced by CT head in 2008  Had ICH in 2009 while on plavix  Not on coumadin in the past  Not on antithrombotics now due to hemorrhagic transformation  Resume metoprolol home dose  May consider anticoagulation with DOAC in the future if pt condition allows.  CHF  TTE pending  Resume lasix 40mg  bid  Resume metoprolol 25mg   bid  Cardiology on board  Hypertension  Stable BP goal < 160 due to hemorrhagic transformation Resume lasix and metoprolol  Hyperlipidemia  Home meds: none  LDL - 76, goal < 70  Hold off statin now due to hemorrhage  Consider low dose statin on discharge.  Other Stroke Risk Factors  Advanced age  Former cigarette smoker - quit in the past  Hx stroke/TIA  Coronary artery disease  Atrial Fibrillation - not anticoagulated  Other Active Problems  Hypernatremia - Na - 154 -> 151->153  CKD - Cre 1.79->1.43 ->1.53  Hypokalemia - 3.2 -> 3.5 ->3.4   Hospital day # 4  This patient is critically ill due to large right MCA infarct with hemorrhagic transformation, afib not on AC, CHF, HTN and at significant risk of neurological worsening, death form recurrent stroke, hemorrhage extension, cerebral edema, brain herniation, heart failure. This patient's care requires constant monitoring of vital signs, hemodynamics, respiratory and cardiac monitoring, review of multiple databases, neurological assessment, discussion with family, other specialists and medical decision making of high complexity. I spent 35 minutes of neurocritical care time in the care of this patient.  Marvel Plan, MD PhD Stroke Neurology 05/10/2016 2:10 PM

## 2016-05-10 NOTE — Progress Notes (Signed)
Pharmacy Antibiotic Note  [name] is a 65 y.o. male with enterobacter PNA on unasyn.  Unasyn has a high risk of resistance due to induction of beta-lactamase activity where sulbactam has shown poor inhibition of this activity. Pharmacy has been consulted to change to Cefepime. -WBC= 6.6, afebrile, SCr= 1.53 and CrCl ~ 50  Plan: -Cefepime 2gm IV q24h -Will follow renal function, cultures and clinical progress   Height: 5\' 9"  (175.3 cm) Weight: 174 lb 6.1 oz (79.1 kg) IBW/kg (Calculated) : 70.7  Temp (24hrs), Avg:98.5 F (36.9 C), Min:97.6 F (36.4 C), Max:99.2 F (37.3 C)   Recent Labs Lab 05/06/16 0830 05/06/16 0839 05/07/16 0430 05/08/16 0315 05/09/16 0415 05/10/16 0205  WBC 8.3  --  6.0 5.8 8.4 6.6  CREATININE 1.78* 1.60* 1.80* 1.79* 1.43* 1.53*    Estimated Creatinine Clearance: 47.5 mL/min (by C-G formula based on SCr of 1.53 mg/dL (H)).    No Known Allergies  Antimicrobials this admission: 10/24 cefepime 10/21 unasyn>> 10/24  Dose adjustments this admission:   Microbiology results:  10/22 blood x2- ngtd 10/22 resp- GPC/clusters, GNR, GPR. enterobacter  10/20 MRSA screen neg  Thank you for allowing pharmacy to be a part of this patient's care.  Harland GermanAndrew Makaya Juneau, Pharm D 05/10/2016 9:11 AM

## 2016-05-10 NOTE — Progress Notes (Signed)
Per neuro MD. If sodium results are 156 or greater, decrease gtt to 5725ml/hr. If sodium is still 156 or greater at next check, then turn off gtt.  If sodium is less than 155 then restart gtt at 50.

## 2016-05-10 NOTE — Progress Notes (Signed)
PULMONARY / CRITICAL CARE MEDICINE   Name: [name] MRN: [id number] DOB: [birth year]    ADMISSION DATE:  05/06/2016  CHIEF COMPLAINT:  Stroke  BRIEF: 65 yo male from [facility] hospital 10/20 after presenting with Lt sided weakness.  He had previous stroke with Lt sided weakness.  CT head showed large vessel occlusion of proximal Rt M1 segment.  There was concern for flash pulmonary edema and he was intubated.  He was then transferred to [facility] for further management.  SUBJECTIVE:  No acute events overnight. Patient did have short run of wide-complex tachycardia. Patient did tolerate 1 hour of PS 5/5 this morning.  REVIEW OF SYSTEMS:  Unable to obtain with intubation.   VITAL SIGNS: BP (!) 142/96   Pulse 80   Temp 98 F (36.7 C) (Axillary)   Resp 17   Ht 5\' 9"  (1.753 m)   Wt 174 lb 6.1 oz (79.1 kg)   SpO2 100%   BMI 25.75 kg/m   HEMODYNAMICS:    VENTILATOR SETTINGS: Vent Mode: PRVC FiO2 (%):  [30 %] 30 % Set Rate:  [16 bmp] 16 bmp Vt Set:  [550 mL] 550 mL PEEP:  [5 cmH20] 5 cmH20 Pressure Support:  [8 cmH20] 8 cmH20 Plateau Pressure:  [14 cmH20-20 cmH20] 14 cmH20  INTAKE / OUTPUT: I/O last 3 completed shifts: In: 5261.7 [I.V.:1351.7; Other:1000; NG/GT:1910; IV Piggyback:1000] Out: 4201 [Urine:4200; Stool:1]  PHYSICAL EXAMINATION: General:  No distress. Wife and sister at bedside. Awake. Neuro: Moving right arm and right foot on command. Response to questions with arm movement. HEENT: Endotracheal tube in place. Pupils symmetric. Cardiovascular: Regular rate and rhythm. No appreciable JVD. Lungs: Coarse breath sounds bilaterally. Symmetric chest wall rise on ventilator.  Abdomen:  Soft.nondistended. Normal bowel sounds.  Integument: Warm and dry. No rash on exposed skin.   LABS:  BMET  Recent Labs Lab 05/08/16 0315  05/09/16 0415  05/09/16 2000 05/10/16 0205 05/10/16 0705  NA 154*  < > 151*  < > 151* 152* 153*  K 3.5  --  3.5  --   --  3.4*  --    CL 126*  --  121*  --   --  121*  --   CO2 23  --  22  --   --  24  --   BUN 17  --  17  --   --  20  --   CREATININE 1.79*  --  1.43*  --   --  1.53*  --   GLUCOSE 139*  --  144*  --   --  122*  --   < > = values in this interval not displayed.  Electrolytes  Recent Labs Lab 05/08/16 0315 05/08/16 1730 05/09/16 0415 05/10/16 0205 05/10/16 0705  CALCIUM 7.9*  --  8.0* 7.7*  --   MG 2.0 2.1 1.9  --  1.8  PHOS 1.9* 2.3* 1.9*  --  3.1    CBC  Recent Labs Lab 05/08/16 0315 05/09/16 0415 05/10/16 0205  WBC 5.8 8.4 6.6  HGB 13.6 14.4 13.3  HCT 40.6 42.4 39.6  PLT 147* 161 157    Coag's  Recent Labs Lab 05/06/16 0955 05/07/16 0430  APTT 39*  --   INR  --  1.41    Sepsis Markers  Recent Labs Lab 05/07/16 1510 05/08/16 0315 05/09/16 0415  PROCALCITON 0.40 0.34 0.32    ABG  Recent Labs Lab 05/06/16 0708 05/06/16 1011  PHART 7.307* 7.434  PCO2ART  51.7* 34.5  PO2ART 228.0* 60.0*    Liver Enzymes  Recent Labs Lab 05/06/16 0830  AST 50*  ALT 20  ALKPHOS 93  BILITOT 1.3*  ALBUMIN 2.9*    Cardiac Enzymes  Recent Labs Lab 05/10/16 0705  TROPONINI 0.60*    Glucose  Recent Labs Lab 05/09/16 1211 05/09/16 1547 05/09/16 2012 05/10/16 0023 05/10/16 0501 05/10/16 0752  GLUCAP 131* 120* 121* 116* 122* 120*    Imaging Ct Head Wo Contrast  Result Date: 05/10/2016 CLINICAL DATA:  Follow-up evaluation.  Neurologically stable. EXAM: CT HEAD WITHOUT CONTRAST TECHNIQUE: Contiguous axial images were obtained from the base of the skull through the vertex without intravenous contrast. COMPARISON:  CT HEAD May 08, 2016 FINDINGS: BRAIN: Evolving RIGHT frontotemporal parietal infarct with scattered petechial hemorrhage. Stable RIGHT lenticulostriate to corona radiata 2.7 x 1.6 cm hemorrhage. Worsening sulcal effacement, 7 mm RIGHT to LEFT midline shift increased from 4 mm with further RIGHT lateral ventricle effacement. No LEFT ventricle  entrapment. Old small LEFT frontoparietal lobe infarct. No abnormal extra-axial fluid collections. Basal cisterns remain patent. VASCULAR: Moderate to severe calcific atherosclerosis of the carotid siphons. SKULL: No skull fracture. Chronic deformity RIGHT mandible condyles. No significant scalp soft tissue swelling. SINUSES/ORBITS: The mastoid air-cells and included paranasal sinuses are well-aerated. Status post LEFT ocular lens implant. The included ocular globes and orbital contents are non-suspicious. OTHER: None. IMPRESSION: Evolving large RIGHT MCA territory infarct with similar focal hemorrhagic conversion. Worsening edema, resulting in 7 mm RIGHT to LEFT midline shift. No LEFT ventricle entrapment. These results will be called to the ordering clinician or representative by the Radiologist Assistant, and communication documented in the PACS or zVision Dashboard. Electronically Signed   By: Awilda Metro M.D.   On: 05/10/2016 05:24    STUDIES:  CT Head 10/20: large vessel occlusion of proximal Rt M1 segment.   CT Head 10/22: evolution of large R MCA stroke, now with significant swelling/ edema, mass effect and peripheral petechial hemorrhage. Hemorrhage within the right lenticular nucleus/ corona radiata. Mass effect upon the right lateral ventricle with 4.3 mm of midline shift to the left. CT Head 10/24:  Evolving large RIGHT MCA territory infarct with similar focal hemorrhagic conversion. Worsening edema, resulting in 7 mm RIGHT to LEFT midline shift. No LEFT ventricle entrapment.  MICROBIOLOGY: MRSA PCR 10/20:  Negative Tracheal Asp Ctx 10/22:  Negative Blood Ctx x2 10/22 >>  ANTIBIOTICS: Unasyn 10/21 >>  SIGNIFICANT EVENTS: 10/20  transfer from [place], neuro consulted 10/21  Waking, following commands  LINES/TUBES: OETT 10/20 >>  L IJ CVL 10/20 >>  Foley 10/20 >> OGT 10/20 >> PIV x3  ASSESSMENT / PLAN:  NEUROLOGIC A:   Acute CVA - H/O prior CVA & Rt thalamic  ICH. Hemorrhagic Transformation w/ Midline Shift - Worsening on 10/24 CT.   P:   RASS goal: 0 Neurology Following & appreciate recommendations 3% NS infusion - Na goal 150-155 Minimize sedation as able  Fentanyl IV prn  PULMONARY A: Acute  Hypoxic Respiratory Failure - Multifactorial w/ some pulmonary edema.  P:   Plan for one-way extubation today per Wife's wishes.  Gentle Diuresis w/ Lasix  See ID   CARDIOVASCULAR A:  Wide Complex Tachycardia  - Appears as V tach on telemetry. H/O Atrial Fibrillation - Not on anticoagulation due to prior ICH. H/O HTN  P:  Continuing telemetry monitoring Vitals per unit protocol TTE read pending Continuing BID lasix for diuresis Amiodarone PO daily  RENAL A:  Hypernatremia - Mild. Secondary to 3% HS. Hypokalemia - Replaced w/ KCL VT. Hypophosphatemia - Resolved. H/O CKD  P:   Monitoring UOP Trending renal function & electrolytes daily Magnesium Sulfate 1gm IV KCl VT x1  GASTROINTESTINAL A:   No acute issues.  P:   Holding tube feedings in anticipation of extubation NPO Pepcid VT q12hr  HEMATOLOGIC A:   No acute issues.  P:  Trending cell counts daily w/ CBC SCDs  INFECTIOUS A:   Questionable Aspiration/CAP  P:   Empiric Unasyn Day #4/7  ENDOCRINE A:   H/O hypothyroidism   P:   Continue Synthroid   TODAY'S SUMMARY:  65 y.o. male [facility] after collapsing at home and had worsening Lt sided weakness.  CT head showed proximal Rt M1 occlusion.  He was outside window for tPA.  He developed acute hypoxic respiratory failure from pulmonary edema and required intubation. lengthy discussion with the patient's wife and sister at bedside. Planning for one-way extubation today. If patient has any pain or increased work of breathing plan for morphine for palliation of symptoms. This was discussed with patient's family at bedside.   I have spent a total of 39 minutes of critical care time today caring  for the patient, reviewing the patient's electronic medical record, and discussing the plan of care with his wife at bedside.  Donna ChristenJennings E. Jamison NeighborNestor, M.D. [facility] Pager:  [phone] After 3pm or if no response, call [phone] 05/10/2016 8:14 AM

## 2016-05-10 NOTE — Progress Notes (Signed)
CRITICAL VALUE ALERT  Critical value received:  Tip 0.60  Date of notification:  05/10/16  Time of notification:  0705  Critical value read back:Yes.    Nurse who received alert:  n Jahanna Raether rn  MD notified (1st page):  nestor  Time of first page:  0705  MD notified (2nd page):  Time of second page:  Responding MD:  Jamison Neighbornestor  Time MD responded:  [phone]

## 2016-05-10 NOTE — Progress Notes (Signed)
Daily Progress Note   Patient Name: [name]       Date: 05/10/2016 DOB: 04/10/1951  Age: 65 y.o. MRN#: [id number] Attending Physician: Raylene Miyamoto, MD Primary Care Physician: Birdie Riddle, MD Admit Date: 05/06/2016  Reason for Consultation/Follow-up: Establishing goals of care  Subjective: Patient more alert today. Plan for one way extubation. Family desiring to take home with Hospice, but understanding that patient may not survive extubation. CT scan this morning shows increasing edema and 62m midline shift.  Review of Systems  Unable to perform ROS: Intubated    Length of Stay: 4  Current Medications: Scheduled Meds:  . amiodarone  200 mg Oral Daily  . ceFEPime (MAXIPIME) IV  2 g Intravenous Q24H  . chlorhexidine gluconate (MEDLINE KIT)  15 mL Mouth Rinse BID  . famotidine  20 mg Per Tube Q12H  . furosemide  40 mg Oral BID  . heparin subcutaneous  5,000 Units Subcutaneous Q8H  . insulin aspart  0-15 Units Subcutaneous Q4H  . levothyroxine  25 mcg Per Tube QAC breakfast  . magnesium sulfate 1 - 4 g bolus IVPB  1 g Intravenous Once  . mouth rinse  15 mL Mouth Rinse 10 times per day  . metoprolol  25 mg Oral BID  . sodium chloride flush  10-40 mL Intracatheter Q12H    Continuous Infusions: . sodium chloride 10 mL/hr at 05/07/16 2236  . feeding supplement (VITAL AF 1.2 CAL) 1,000 mL (05/09/16 2248)  . sodium chloride (hypertonic) 50 mL/hr (05/10/16 0746)    PRN Meds: acetaminophen, atropine, fentaNYL (SUBLIMAZE) injection, hydrALAZINE, labetalol, morphine injection, sodium chloride flush  Physical Exam  Constitutional: He appears well-developed and well-nourished. No distress.  Cardiovascular: Normal rate and regular rhythm.   Pulmonary/Chest:    endotrachial tube in place, weaned from ventilator support  Neurological: He is alert.  Awake, tracking, follows commands with R arm   Skin: Skin is warm and dry.            Vital Signs: BP (!) 142/96   Pulse 80   Temp 98 F (36.7 C) (Axillary)   Resp 17   Ht '5\' 9"'$  (1.753 m)   Wt 79.1 kg (174 lb 6.1 oz)   SpO2 100%   BMI 25.75 kg/m  SpO2: SpO2: 100 % O2 Device: O2  Device: Ventilator O2 Flow Rate:    Intake/output summary:   Intake/Output Summary (Last 24 hours) at 05/10/16 0932 Last data filed at 05/10/16 0600  Gross per 24 hour  Intake          3921.67 ml  Output             2801 ml  Net          1120.67 ml   LBM: Last BM Date: 05/09/16 Baseline Weight: Weight: 79.4 kg (175 lb 0.7 oz) Most recent weight: Weight: 79.1 kg (174 lb 6.1 oz)       Palliative Assessment/Data: PPS: 10%    Flowsheet Rows   Flowsheet Row Most Recent Value  Intake Tab  Referral Department  Neurology  Unit at Time of Referral  Cardiac/Telemetry Unit  Palliative Care Primary Diagnosis  Neurology  Date Notified  05/07/16  Palliative Care Type  New Palliative care  Reason for referral  Clarify Goals of Care  Date of Admission  05/06/16  Date first seen by Palliative Care  05/08/16  # of days Palliative referral response time  1 Day(s)  # of days IP prior to Palliative referral  1  Clinical Assessment  Palliative Performance Scale Score  30%  Pain Max last 24 hours  Not able to report  Pain Min Last 24 hours  Not able to report  Dyspnea Max Last 24 Hours  Not able to report  Dyspnea Min Last 24 hours  Not able to report  Nausea Max Last 24 Hours  Not able to report  Nausea Min Last 24 Hours  Not able to report  Anxiety Max Last 24 Hours  Not able to report  Anxiety Min Last 24 Hours  Not able to report  Other Max Last 24 Hours  Not able to report  Psychosocial & Spiritual Assessment  Palliative Care Outcomes  Patient/Family meeting held?  Yes  Who was at the meeting?  wife   Palliative Care Outcomes  Clarified goals of care, Provided psychosocial or spiritual support  Patient/Family wishes: Interventions discontinued/not started   Mechanical Ventilation      Patient Active Problem List   Diagnosis Date Noted  . Goals of care, counseling/discussion   . Palliative care encounter   . Cerebral edema (HCC)   . Acute respiratory failure with hypoxia ([place]) 05/07/2016  . CVA (cerebral vascular accident) ([place]) 05/06/2016    Palliative Care Assessment & Plan   Patient Profile: 65 y.o. male  with past medical history of Stroke, intracranial hemorrhage, chronic kidney disease, atrial fib, hypertension, congestive heart failure, hyperlipidemia, admitted on 05/06/2016 [facility] with signs and symptoms of new stroke. He was transferred to [facility] and admitted on 05/06/2016. Initial CT scan showed evolving right MCA stroke with mass effect, without midline shift. CT scan today shows worsening of infarct with increasing edema and mass effect, petechial hemorrhage. Followup CT scan is scheduled for tomorrow. He is intubated but is now off of sedation and is breathing on his own.   Assessment/Recommendations/Plan   Code status: DNR  One way extubation to be managed by Pulmonary/CCM  Home with hospice if patient survives extubation  Recommend robinul .'2mg'$  IV prior to extubation  Morphine as ordered by CCM for SOB  Goals of Care and Additional Recommendations:  Limitations on Scope of Treatment: No Artificial Feeding and No Tracheostomy  Code Status:  DNR  Prognosis:   Unable to determine , will be further determined after patient is extubated.  May be hours if patient is unable to protect airway after extubation. May be weeks if patient able to survive extubation.  Discharge Planning:  To Be Determined Hospital death vs. Home with hospice.  Care plan was discussed with patient's wife, Dr. Creig Hines.  Thank you for allowing the Palliative  Medicine Team to assist in the care of this patient. Please contact for any needs.    Time In: 0900 Time Out: 0930 Total Time 30 mins Prolonged Time Billed No      Greater than 50%  of this time was spent counseling and coordinating care related to the above assessment and plan.  Mariana Kaufman, AGNP-C Palliative Medicine   Please contact Palliative Medicine Team phone at [phone] for questions and concerns.

## 2016-05-11 DIAGNOSIS — I480 Paroxysmal atrial fibrillation: Secondary | ICD-10-CM

## 2016-05-11 DIAGNOSIS — Z515 Encounter for palliative care: Secondary | ICD-10-CM

## 2016-05-11 DIAGNOSIS — G936 Cerebral edema: Secondary | ICD-10-CM

## 2016-05-11 DIAGNOSIS — Z8673 Personal history of transient ischemic attack (TIA), and cerebral infarction without residual deficits: Secondary | ICD-10-CM

## 2016-05-11 DIAGNOSIS — Z7189 Other specified counseling: Secondary | ICD-10-CM

## 2016-05-11 DIAGNOSIS — E785 Hyperlipidemia, unspecified: Secondary | ICD-10-CM

## 2016-05-11 DIAGNOSIS — I10 Essential (primary) hypertension: Secondary | ICD-10-CM

## 2016-05-11 DIAGNOSIS — I6300 Cerebral infarction due to thrombosis of unspecified precerebral artery: Secondary | ICD-10-CM

## 2016-05-11 DIAGNOSIS — I5023 Acute on chronic systolic (congestive) heart failure: Secondary | ICD-10-CM

## 2016-05-11 LAB — ECHOCARDIOGRAM COMPLETE
Height: 69 in
WEIGHTICAEL: 2832.47 [oz_av]

## 2016-05-11 LAB — CBC WITH DIFFERENTIAL/PLATELET
BASOS PCT: 1 %
Basophils Absolute: 0.1 10*3/uL (ref 0.0–0.1)
EOS ABS: 0 10*3/uL (ref 0.0–0.7)
Eosinophils Relative: 0 %
HCT: 42.6 % (ref 39.0–52.0)
HEMOGLOBIN: 14.3 g/dL (ref 13.0–17.0)
Lymphocytes Relative: 15 %
Lymphs Abs: 1.2 10*3/uL (ref 0.7–4.0)
MCH: 27.2 pg (ref 26.0–34.0)
MCHC: 33.6 g/dL (ref 30.0–36.0)
MCV: 81 fL (ref 78.0–100.0)
Monocytes Absolute: 0.8 10*3/uL (ref 0.1–1.0)
Monocytes Relative: 9 %
NEUTROS PCT: 75 %
Neutro Abs: 6.3 10*3/uL (ref 1.7–7.7)
PLATELETS: 169 10*3/uL (ref 150–400)
RBC: 5.26 MIL/uL (ref 4.22–5.81)
RDW: 16.9 % — ABNORMAL HIGH (ref 11.5–15.5)
WBC: 8.4 10*3/uL (ref 4.0–10.5)

## 2016-05-11 LAB — CULTURE, RESPIRATORY: SPECIAL REQUESTS: NORMAL

## 2016-05-11 LAB — GLUCOSE, CAPILLARY
GLUCOSE-CAPILLARY: 81 mg/dL (ref 65–99)
GLUCOSE-CAPILLARY: 97 mg/dL (ref 65–99)
Glucose-Capillary: 105 mg/dL — ABNORMAL HIGH (ref 65–99)
Glucose-Capillary: 89 mg/dL (ref 65–99)

## 2016-05-11 LAB — RENAL FUNCTION PANEL
ALBUMIN: 2.2 g/dL — AB (ref 3.5–5.0)
ANION GAP: 9 (ref 5–15)
BUN: 22 mg/dL — ABNORMAL HIGH (ref 6–20)
CALCIUM: 8.7 mg/dL — AB (ref 8.9–10.3)
CO2: 24 mmol/L (ref 22–32)
Chloride: 123 mmol/L — ABNORMAL HIGH (ref 101–111)
Creatinine, Ser: 1.37 mg/dL — ABNORMAL HIGH (ref 0.61–1.24)
GFR, EST NON AFRICAN AMERICAN: 52 mL/min — AB (ref >60)
Glucose, Bld: 110 mg/dL — ABNORMAL HIGH (ref 65–99)
PHOSPHORUS: 2.8 mg/dL (ref 2.5–4.6)
Potassium: 4 mmol/L (ref 3.5–5.1)
SODIUM: 156 mmol/L — AB (ref 135–145)

## 2016-05-11 LAB — SODIUM
SODIUM: 156 mmol/L — AB (ref 135–145)
Sodium: 155 mmol/L — ABNORMAL HIGH (ref 135–145)

## 2016-05-11 LAB — CULTURE, RESPIRATORY W GRAM STAIN

## 2016-05-11 LAB — MAGNESIUM: MAGNESIUM: 2.1 mg/dL (ref 1.7–2.4)

## 2016-05-11 MED ORDER — LORAZEPAM 2 MG/ML PO CONC: 1.0000 mg | ORAL | Status: DC | PRN

## 2016-05-11 MED ORDER — HALOPERIDOL LACTATE 5 MG/ML IJ SOLN: 0.5000 mg | INTRAMUSCULAR | Status: DC | PRN

## 2016-05-11 MED ORDER — BIOTENE DRY MOUTH MT LIQD: 15.0000 mL | OROMUCOSAL | Status: DC | PRN

## 2016-05-11 MED ORDER — MORPHINE SULFATE (CONCENTRATE) 10 MG/0.5ML PO SOLN: 5.0000 mg | ORAL | Status: DC | PRN

## 2016-05-11 MED ORDER — GLYCOPYRROLATE 1 MG PO TABS
1.0000 mg | ORAL_TABLET | ORAL | Status: DC | PRN
  Filled 2016-05-11: qty 1

## 2016-05-11 MED ORDER — DIPHENHYDRAMINE HCL 50 MG/ML IJ SOLN: 12.5000 mg | INTRAMUSCULAR | Status: DC | PRN

## 2016-05-11 MED ORDER — DEXTROSE 5 % IV SOLN: 2.0000 g | INTRAVENOUS | 0 refills | Status: DC

## 2016-05-11 MED ORDER — ONDANSETRON 4 MG PO TBDP: 4.0000 mg | ORAL_TABLET | Freq: Four times a day (QID) | ORAL | Status: DC | PRN

## 2016-05-11 MED ORDER — POLYVINYL ALCOHOL 1.4 % OP SOLN
1.0000 [drp] | Freq: Four times a day (QID) | OPHTHALMIC | Status: DC | PRN
  Filled 2016-05-11: qty 15

## 2016-05-11 MED ORDER — LORAZEPAM 1 MG PO TABS: 1.0000 mg | ORAL_TABLET | ORAL | Status: DC | PRN

## 2016-05-11 MED ORDER — HALOPERIDOL LACTATE 2 MG/ML PO CONC
0.5000 mg | ORAL | Status: DC | PRN
  Filled 2016-05-11: qty 0.3

## 2016-05-11 MED ORDER — GLYCOPYRROLATE 0.2 MG/ML IJ SOLN
0.2000 mg | INTRAMUSCULAR | Status: DC | PRN
  Filled 2016-05-11: qty 1

## 2016-05-11 MED ORDER — ONDANSETRON 4 MG PO TBDP: 4.0000 mg | ORAL_TABLET | Freq: Four times a day (QID) | ORAL | 0 refills | Status: AC | PRN

## 2016-05-11 MED ORDER — GLYCOPYRROLATE 1 MG PO TABS: 1.0000 mg | ORAL_TABLET | ORAL | 0 refills | Status: AC | PRN

## 2016-05-11 MED ORDER — LORAZEPAM 2 MG/ML PO CONC: 1.0000 mg | ORAL | 0 refills | Status: AC | PRN

## 2016-05-11 MED ORDER — HALOPERIDOL 0.5 MG PO TABS
0.5000 mg | ORAL_TABLET | ORAL | Status: DC | PRN
  Filled 2016-05-11: qty 1

## 2016-05-11 MED ORDER — ONDANSETRON HCL 4 MG/2ML IJ SOLN: 4.0000 mg | Freq: Four times a day (QID) | INTRAMUSCULAR | Status: DC | PRN

## 2016-05-11 MED ORDER — LORAZEPAM 2 MG/ML IJ SOLN: 1.0000 mg | INTRAMUSCULAR | Status: DC | PRN

## 2016-05-11 MED ORDER — MORPHINE SULFATE (CONCENTRATE) 10 MG/0.5ML PO SOLN
5.0000 mg | ORAL | Status: DC | PRN
Start: 2016-05-11 — End: 2016-05-11

## 2016-05-11 MED ORDER — TRAZODONE HCL 50 MG PO TABS: 25.0000 mg | ORAL_TABLET | Freq: Every evening | ORAL | Status: DC | PRN

## 2016-05-11 MED ORDER — MORPHINE SULFATE (CONCENTRATE) 10 MG/0.5ML PO SOLN: 5.0000 mg | ORAL | 0 refills | Status: AC | PRN

## 2016-05-11 NOTE — Progress Notes (Signed)
Daily Progress Note   Patient Name: [name]       Date: 05/11/2016 DOB: 21-Feb-1951  Age: 65 y.o. MRN#: [id number] Attending Physician: Nelda Bucksaniel J Feinstein, MD Primary Care Physician: Ricki RodriguezKADAKIA,AJAY S, MD Admit Date: 05/06/2016  Reason for Consultation/Follow-up: Establishing goals of care  Subjective: Patient extubated yesterday without difficulty. [facility] with comfort measures. Patient's sister in room with multiple questions regarding patient's plan of care. Note plan is for comfort care. Discussed meaning of comfort care. Patient's wife notes patient had a difficult night last night with agitation. Discussed that agitation is noted frequently with prolonged hospital stays and end of life situations. Educated [place] that agitation can also indicate pain. Reviewed with her that comfort medications can be used to ease agitation and pain symptoms. Rosa has aides and nurses who are going to be available to help her care for husband in her home.   Review of Systems  Unable to perform ROS: Patient nonverbal    Length of Stay: 5  Current Medications: Scheduled Meds:  . ceFEPime (MAXIPIME) IV  2 g Intravenous Q24H  . mouth rinse  15 mL Mouth Rinse BID  . sodium chloride flush  10-40 mL Intracatheter Q12H    Continuous Infusions: . sodium chloride Stopped (05/11/16 0300)    PRN Meds: acetaminophen, antiseptic oral rinse, diphenhydrAMINE, glycopyrrolate **OR** glycopyrrolate **OR** glycopyrrolate, haloperidol **OR** haloperidol **OR** haloperidol lactate, labetalol, LORazepam **OR** LORazepam **OR** LORazepam, morphine injection, morphine CONCENTRATE **OR** morphine CONCENTRATE, ondansetron **OR** ondansetron (ZOFRAN) IV,  polyvinyl alcohol, sodium chloride flush, traZODone  Physical Exam  Constitutional: He appears well-developed and well-nourished. No distress.  Cardiovascular: Normal rate and regular rhythm.   Pulmonary/Chest: Effort normal. No respiratory distress.  Neurological: He is alert.  Awake, tracking, follows commands with R arm, does not appear oriented, nonverbal  Skin: Skin is warm and dry.            Vital Signs: BP (!) 147/94 (BP Location: Left Arm)   Pulse 88   Temp 99.2 F (37.3 C) (Oral)   Resp (!) 30   Ht 5\' 9"  (1.753 m)   Wt 78.5 kg (173 lb 1 oz)   SpO2 98%   BMI 25.56 kg/m  SpO2: SpO2: 98 % O2 Device: O2 Device: Not  Delivered O2 Flow Rate: O2 Flow Rate (L/min): 3 L/min  Intake/output summary:   Intake/Output Summary (Last 24 hours) at 05/11/16 1351 Last data filed at 05/11/16 1200  Gross per 24 hour  Intake           648.33 ml  Output             1025 ml  Net          -376.67 ml   LBM: Last BM Date: 05/10/16 Baseline Weight: Weight: 79.4 kg (175 lb 0.7 oz) Most recent weight: Weight: 78.5 kg (173 lb 1 oz)       Palliative Assessment/Data: PPS: 10%    Flowsheet Rows   Flowsheet Row Most Recent Value  Intake Tab  Referral Department  Neurology  Unit at Time of Referral  Cardiac/Telemetry Unit  Palliative Care Primary Diagnosis  Neurology  Date Notified  05/07/16  Palliative Care Type  New Palliative care  Reason for referral  Clarify Goals of Care  Date of Admission  05/06/16  Date first seen by Palliative Care  05/08/16  # of days Palliative referral response time  1 Day(s)  # of days IP prior to Palliative referral  1  Clinical Assessment  Palliative Performance Scale Score  30%  Pain Max last 24 hours  Not able to report  Pain Min Last 24 hours  Not able to report  Dyspnea Max Last 24 Hours  Not able to report  Dyspnea Min Last 24 hours  Not able to report  Nausea Max Last 24 Hours  Not able to report  Nausea Min Last 24 Hours  Not able to report    Anxiety Max Last 24 Hours  Not able to report  Anxiety Min Last 24 Hours  Not able to report  Other Max Last 24 Hours  Not able to report  Psychosocial & Spiritual Assessment  Palliative Care Outcomes  Patient/Family meeting held?  Yes  Who was at the meeting?  wife  Palliative Care Outcomes  Clarified goals of care, Provided psychosocial or spiritual support  Patient/Family wishes: Interventions discontinued/not started   Mechanical Ventilation      Patient Active Problem List   Diagnosis Date Noted  . Acute on chronic systolic congestive heart failure (HCC)   . Essential hypertension   . Hyperlipidemia   . Advance care planning   . Palliative care by specialist   . Endotracheal tube present   . Paroxysmal atrial fibrillation (HCC)   . History of stroke   . Goals of care, counseling/discussion   . Palliative care encounter   . Cerebral edema (HCC)   . Acute respiratory failure with hypoxia (HCC) 05/07/2016  . Cerebrovascular accident (CVA) due to thrombosis of precerebral artery (HCC) 05/06/2016    Palliative Care Assessment & Plan   Patient Profile: 65 y.o. male  with past medical history of Stroke, intracranial hemorrhage, chronic kidney disease, atrial fib, hypertension, congestive heart failure, hyperlipidemia, admitted on 05/06/2016 FROM [facility] with signs and symptoms of new stroke. He was transferred to [facility] and admitted on 05/06/2016. Initial CT scan showed evolving right MCA stroke with mass effect, without midline shift. CT scan today shows worsening of infarct with increasing edema and mass effect, petechial hemorrhage. Followup CT scan is scheduled for tomorrow. He is extubated.  Assessment/Recommendations/Plan   Code status: DNR  D/C home with hospice  Comfort medications as ordered  Goals of Care and Additional Recommendations:  Limitations on Scope of Treatment: No  Artificial Feeding and No Tracheostomy  Code  Status:  DNR  Prognosis:  < 2 weeks d/t patient with severe dysphagia, s/p CVA with cerebral edema, midline shift, comfort measures only.  Discharge Planning: Home with Hospice  Care plan was discussed with patient's wife, Dr. Marlyne Beards.  Thank you for allowing the Palliative Medicine Team to assist in the care of this patient. Please contact for any needs.    Time In: 0900 Time Out: 0930 Total Time 30 mins Prolonged Time Billed No      Greater than 50%  of this time was spent counseling and coordinating care related to the above assessment and plan.  Ocie Bob, AGNP-C Palliative Medicine   Please contact Palliative Medicine Team phone at [phone] for questions and concerns.

## 2016-05-11 NOTE — Progress Notes (Signed)
Patient discharge paperwork given to wife as well as prescriptions and patients belongings. Patient external catheter removed, rectal tube removed, both peripheral iv's removed intact as well as patients central line removed intact. Patient discharged to home with hospice by way of EMS.

## 2016-05-11 NOTE — Progress Notes (Signed)
[facility] has ordered eq and all del. Pt on [facility] for pick up. [facility] will see pt this evening when he gets home.

## 2016-05-11 NOTE — Progress Notes (Signed)
STROKE TEAM PROGRESS NOTE   SUBJECTIVE (INTERVAL HISTORY) His wife and palliative care NP are at bedside. They are discussing about home hospice. Likely pt will d/c home today with home hospice. Extubated yesterday and tolerating well, overnight off 3% saline, and Na 155 this am, on target.    OBJECTIVE Temp:  [99 F (37.2 C)-101 F (38.3 C)] 99.2 F (37.3 C) (10/25 1157) Pulse Rate:  [86-111] 104 (10/25 1200) Cardiac Rhythm: Sinus tachycardia (10/25 1200) Resp:  [17-39] 30 (10/25 1200) BP: (145-182)/(90-115) 162/104 (10/25 1200) SpO2:  [94 %-100 %] 97 % (10/25 1200) Weight:  [173 lb 1 oz (78.5 kg)] 173 lb 1 oz (78.5 kg) (10/25 0400)  CBC:   Recent Labs Lab 05/10/16 0205 05/11/16 0200  WBC 6.6 8.4  NEUTROABS  --  6.3  HGB 13.3 14.3  HCT 39.6 42.6  MCV 81.0 81.0  PLT 157 169    Basic Metabolic Panel:  Recent Labs Lab 05/10/16 0705  05/10/16 1805 05/11/16 0200 05/11/16 0545 05/11/16 0800  NA 153*  < > 156* 156* 156* 155*  K  --   --  4.0  --  4.0  --   CL  --   --  125*  --  123*  --   CO2  --   --  24  --  24  --   GLUCOSE  --   --  111*  --  110*  --   BUN  --   --  19  --  22*  --   CREATININE  --   --  1.43*  --  1.37*  --   CALCIUM  --   --  8.4*  --  8.7*  --   MG 1.8  --   --  2.1  --   --   PHOS 3.1  --   --   --  2.8  --   < > = values in this interval not displayed.  Lipid Panel:     Component Value Date/Time   CHOL 117 05/07/2016 0430   TRIG 83 05/09/2016 0415   HDL 26 (L) 05/07/2016 0430   CHOLHDL 4.5 05/07/2016 0430   VLDL 15 05/07/2016 0430   LDLCALC 76 05/07/2016 0430   HgbA1c:  Lab Results  Component Value Date   HGBA1C 4.5 (L) 05/07/2016   Urine Drug Screen: No results found for: LABOPIA, COCAINSCRNUR, LABBENZ, AMPHETMU, THCU, LABBARB    IMAGING I have personally reviewed the radiological images below and agree with the radiology interpretations.  Ct Cerebral Perfusion W Contrast 05/06/2016   1. CT Brain Perfusion analysis  significantly confounded by right MCA territory parenchymal staining from the 0330 hours CTA today.  2. Large area of infarct core indicated on the basis of plain CT (current ASPECTS 0-3) is not detected on this perfusion study. 3. Furthermore, the CT Perfusion source images suggest there has been some interval Re-cannulation of the right MCA M1 since 0330 hours.   Ct Head Code Stroke Wo Contrast 05/06/2016 1. Evolving large right MCA infarct, with areas of parenchymal contrast staining from the CTA performed at 0330 hours today. Mild mass effect on the right lateral ventricle without midline shift. No hemorrhagic transformation suspected.  2. ASPECTS is at best 3 and at worst 0.   CT Head Wo Contrast 05/08/2016 IMPRESSION: Evolution of large right middle cerebral artery distribution infarct now with significant swelling/ edema, mass effect and peripheral petechial hemorrhage. Hemorrhage within the right lenticular nucleus/ corona  radiata. Mass effect upon the right lateral ventricle with 4.3 mm of midline shift to the left.   Dg Chest Port 1 View 05/09/2016 IMPRESSION: 1. Lines and tubes in stable position. 2. Congestive heart failure with bilateral pulmonary interstitial edema and small left pleural effusion. Similar findings noted on prior exam. Low lung volumes.  CUS - Findings are consistent with a 1-39 percent stenosis involving the right internal carotid artery and the left internal carotid artery. The vertebral arteries demonstrate antegrade flow.  TTE  - Left ventricle: The cavity size was normal. There was mild   concentric hypertrophy. Systolic function was moderately to   severely reduced. The estimated ejection fraction was in the   range of 30% to 35%. There is severe hypokinesis of the   inferolateral and inferior myocardium. There is mild hypokinesis   of the entireanteroseptal myocardium. - Mitral valve: Calcified annulus. There was severe regurgitation. - Left atrium: The  atrium was moderately dilated. - Right atrium: The atrium was mildly dilated.   Ct Head Wo Contrast 05/10/2016 IMPRESSION: Evolving large RIGHT MCA territory infarct with similar focal hemorrhagic conversion. Worsening edema, resulting in 7 mm RIGHT to LEFT midline shift. No LEFT ventricle entrapment.    PHYSICAL EXAM  Temp:  [99 F (37.2 C)-101 F (38.3 C)] 99.2 F (37.3 C) (10/25 1157) Pulse Rate:  [86-111] 104 (10/25 1200) Resp:  [17-39] 30 (10/25 1200) BP: (145-182)/(90-115) 162/104 (10/25 1200) SpO2:  [94 %-100 %] 97 % (10/25 1200) Weight:  [173 lb 1 oz (78.5 kg)] 173 lb 1 oz (78.5 kg) (10/25 0400)  General - Well nourished, well developed, not in acute distress.  Ophthalmologic - Fundi not visualized due to noncooperation.  Cardiovascular - Regular rate and rhythm.  Neuro - awake alert, eyes open and following most simple commands both midline and peripheral commands on the right UE and LE. Mumbles on words and intelligible. Not able to name or repeat. PERRL, eyes right gaze preference and but able to cross midline to the left. Not blinking to visual threat on the left. Right facial droop. Tongue in midline. Moving RUE 5/5, RLE proximal 3/5 and distal 3+/5. LUE 0/5 with increased tone, LLE minimal withdraw to pain stimulation. RUE and RLE 3+ DTR and right babinski positive. Sensation, coordination and gait not tested.    ASSESSMENT/PLAN [name] is a 65 y.o. male with history of CHF, HTN, HLD, hx of ischemic strokes and hx of right BG ICH in 2009 while on plavix, CKD, CAD, afib on ASA presenting with left sided weakness and aphasia. He did not receive IV t-PA due to late presentation.  Right MCA infarct with hemorrhagic transformation likely secondary to atrial fibrillation not on AC.  Resultant  Left sided weakness, left hemianopia and intelligible speech  MRI - not performed.  MRA - not performed  CTP - right MCA M1 occlusion  CT - Evolving large  right MCA infarct with hemorrhage transformation and midline shift  Repeat CT - large right MCA infarct with hemorrhage transformation and midline shift  Carotid Doppler - unremarkable  2D Echo - EF 30-35%, but no throumbus  LDL - 76  HgbA1c 4.5  VTE prophylaxis - subcutaneous Heparin / SCDs  aspirin 81 mg daily prior to admission, now on no antithrombotics due to hemorrhagic transformation  Ongoing aggressive stroke risk factor management  Disposition: home hospice  Palliative care on board and wife requested home hospice care.  Cerebral edema  CT showed midline shift  Off 3% saline now   Na goal 150-155  Na 151->153->155 -> 155  D/c mannitol   palliative care on board and wife requested home hospice care.  History of stroke  2008 head CT showed multifocal remote ischemic strokes  2009 head CT showed right BG ICH - left sided residue - on plavix at that time  [facility]  Has previous ischemic stroke evidenced by CT head in 2008  Had ICH in 2009 while on plavix  Not on coumadin in the past  Not on antithrombotics now due to hemorrhagic transformation  CHF  TTE EF 30-35%  Resume lasix 40mg  bid  Resume metoprolol 25mg  bid  Cardiology on board  Transition to home hospice  Hypertension  Stable BP goal < 160 due to hemorrhagic transformation Resume lasix and metoprolol  Hyperlipidemia  Home meds: none  LDL - 76, goal < 70  Hold off statin now due to hemorrhage  Other Stroke Risk Factors  Advanced age  Former cigarette smoker - quit in the past  Hx stroke/TIA  Coronary artery disease  Other Active Problems  Hypernatremia - Na - 154 -> 151->153 -> 155  CKD - Cre 1.79->1.43 ->1.53 -> 1.37  Hypokalemia - 3.2 -> 3.5 ->3.4->4.0   Hospital day # 5  Pt now transition to comfort care and home hospice. Neurology will sign off. Please call with questions. Thanks for the consult.   Marvel PlanJindong Chenille Toor, MD PhD Stroke  Neurology 05/11/2016 12:30 PM

## 2016-05-11 NOTE — Progress Notes (Signed)
D/c home

## 2016-05-11 NOTE — Progress Notes (Signed)
No charge note. Full note to follow.  [facility]. Equipment has been ordered for patient and timeframe to be delivered is between 1-5pm today. Family member is awaiting delivery and then patient can be transported via [facility].  Ocie BobKasie Mahan, AGNP-C Palliative Medicine  Please call Palliative Medicine team phone with any questions [phone]. For individual providers please see AMION.

## 2016-05-11 NOTE — Discharge Summary (Signed)
Physician Discharge Summary  Patient ID: [name] MRN: [id number] DOB/AGE: [birth year] 64 y.o.  Admit date: 05/06/2016 Discharge date: 05/11/2016    Discharge Diagnoses:  Active Problems:   Cerebrovascular accident (CVA) due to thrombosis of precerebral artery (HCC)   Acute respiratory failure with hypoxia [facility])   Palliative care encounter   Cerebral edema (HCC)   Goals of care, counseling/discussion   Advance care planning   Palliative care by specialist   Endotracheal tube present   Paroxysmal atrial fibrillation (HCC)   History of stroke   Acute on chronic systolic congestive heart failure (HCC)   Essential hypertension   Hyperlipidemia                                                       D/c plan by Discharge Diagnosis   Acute CVA - H/O prior CVA & Rt thalamic ICH. Hemorrhagic Transformation w/ Midline Shift - Worsening on 10/24 CT.  P:   Comfort measures  Pleasure feeds  Home with hospice    Acute  Hypoxic Respiratory Failure - Resolved. Multifactorial w/ some pulmonary edema. Extubated 10/24. MSSA & Enterobacter Pneumonia P:   No further lasix  S/p 5 days abx - ideally would continue a few more days but hospice unable to do IV abx, pt cannot take PO's, family desperately wants to take pt home so will hold off for now  PRN morphine, robinul, ativan  Wide Complex Tachycardia  - Appears as V tach on telemetry 10/24 - resolved.  H/O Atrial Fibrillation - Not on anticoagulation due to prior ICH. H/O HTN P:  Holding Lasix, Lopressor, & Amiodarone   Hypernatremia - Mild & stable. Secondary to 3% HS. Hypokalemia - Resolved. Hypophosphatemia - Resolved. H/O CKD P:   Comfort feeds   Severe Aspiration Risk - Per Speech Therapy Eval. P:   Comfort feeds only - family aware of significant aspiration risk  No PO meds    Brief Summary: [name] is a 64 y.o. y/o male with a PMH of AFib (only on aspirin due to previous intracranial  hemorrhage), CKD, CHF, previous CVA, HTN presented 10/20 from [facility] with AMS, aphasia and L sided weakness. CT head revealed large R MCA occlusion.  There was concern at [facility] for flash pulmonary edema and he was intubated and tx to [facility].  IR intervention was felt not an option per stroke team. He was treated with 3% NaCl.  Subsequent head CT revealed evolving infarct with hemorrhagic conversion and midline shift.  Course was c/b MSSA and enterobacter PNA and fever.   Pt's wife and sons had multiple discussions with PCCM, neuro and palliative care regarding overall poor long-term prognosis.  Family was all in agreement that pt would not want further aggressive care and one-way extubation was planned for 10/24.  He is relatively stable from a respiratory standpoint post extubation.  He is awake although significantly aphasic.  Wife wants to take pt home with hospice care.     STUDIES:  CT Head 10/20: large vessel occlusion of proximal Rt M1 segment.   CT Head 10/22: evolution of large R MCA stroke, now with significant swelling/ edema, mass effect and peripheral petechial hemorrhage. Hemorrhage within the right lenticular nucleus/ corona radiata. Mass effect upon the right lateral ventricle with 4.3 mm of midline shift to  the left. CT Head 10/24:  Evolving large RIGHT MCA territory infarct with similar focal hemorrhagic conversion. Worsening edema, resulting in 7 mm RIGHT to LEFT midline shift. No LEFT ventricle entrapment. TTE 10/23:  EF 30-35%. Severe hypokinesis of the inferior lateral & inferior myocardium with mild hypokinesis of the entire anteroseptal myocardium. LA moderately dilated & RA mildly dilated. RV normal in size and function. No aortic stenosis or regurgitation. Severe mitral regurgitation without stenosis. No pulmonic stenosis. Mild tricuspid regurgitation. No pericardial effusion.  MICROBIOLOGY: MRSA PCR 10/20:  Negative Tracheal Asp Ctx 10/22:  MSSA &  Enterococcus Blood Ctx x2 10/22 >>  ANTIBIOTICS: Unasyn 10/21 - 10/24 Cefepime 10/24 >>  SIGNIFICANT EVENTS: 10/20  transfer from [place], neuro consulted 10/21  Waking, following commands  LINES/TUBES: OETT 10/20 - 10/24 OGT 10/20 - 10/24 L IJ CVL 10/20 >>  Foley 10/20 >> PIV x3    Vitals:   05/11/16 1157 05/11/16 1200 05/11/16 1300 05/11/16 1400  BP:  (!) 162/104 (!) 147/94 (!) 144/101  Pulse:  (!) 104 88 87  Resp:  (!) 30 (!) 30 (!) 26  Temp: 99.2 F (37.3 C)     TempSrc: Oral     SpO2:  97% 98% 97%  Weight:      Height:         Discharge Labs  BMET  Recent Labs Lab 05/08/16 0315  05/08/16 1730  05/09/16 0415  05/10/16 0205 05/10/16 0705 05/10/16 1258 05/10/16 1805 05/11/16 0200 05/11/16 0545 05/11/16 0800  NA 154*  < >  --   < > 151*  < > 152* 153* 155* 156* 156* 156* 155*  K 3.5  --   --   --  3.5  --  3.4*  --   --  4.0  --  4.0  --   CL 126*  --   --   --  121*  --  121*  --   --  125*  --  123*  --   CO2 23  --   --   --  22  --  24  --   --  24  --  24  --   GLUCOSE 139*  --   --   --  144*  --  122*  --   --  111*  --  110*  --   BUN 17  --   --   --  17  --  20  --   --  19  --  22*  --   CREATININE 1.79*  --   --   --  1.43*  --  1.53*  --   --  1.43*  --  1.37*  --   CALCIUM 7.9*  --   --   --  8.0*  --  7.7*  --   --  8.4*  --  8.7*  --   MG 2.0  --  2.1  --  1.9  --   --  1.8  --   --  2.1  --   --   PHOS 1.9*  --  2.3*  --  1.9*  --   --  3.1  --   --   --  2.8  --   < > = values in this interval not displayed.   CBC   Recent Labs Lab 05/09/16 0415 05/10/16 0205 05/11/16 0200  HGB 14.4 13.3 14.3  HCT 42.4 39.6 42.6  WBC 8.4 6.6 8.4  PLT 161 157 169   Anti-Coagulation  Recent Labs Lab 05/07/16 0430  INR 1.41      Discharge Instructions    Discharge patient    Complete by:  As directed    Discontinue Central Line    Complete by:  As directed       Follow-up Information    [facility] S, MD. Call today.    Specialty:  Cardiology Contact information: [place] [address] [phone]              Medication List    STOP taking these medications   amiodarone 200 MG tablet Commonly known as:  PACERONE   aspirin EC 81 MG tablet   colchicine 0.6 MG tablet   furosemide 40 MG tablet Commonly known as:  LASIX   isosorbide mononitrate 30 MG 24 hr tablet Commonly known as:  IMDUR   levothyroxine 50 MCG tablet Commonly known as:  SYNTHROID, LEVOTHROID   metoprolol 50 MG tablet Commonly known as:  LOPRESSOR   naproxen sodium 220 MG tablet Commonly known as:  ANAPROX   VITAMIN D PO     TAKE these medications   glycopyrrolate 1 MG tablet Commonly known as:  ROBINUL Take 1 tablet (1 mg total) by mouth every 4 (four) hours as needed (excessive secretions).   LORazepam 2 MG/ML concentrated solution Commonly known as:  ATIVAN Place 0.5 mLs (1 mg total) under the tongue every 4 (four) hours as needed for anxiety.   morphine CONCENTRATE 10 MG/0.5ML Soln concentrated solution Take 0.25 mLs (5 mg total) by mouth every 2 (two) hours as needed for moderate pain (or dyspnea).   ondansetron 4 MG disintegrating tablet Commonly known as:  ZOFRAN-ODT Take 1 tablet (4 mg total) by mouth every 6 (six) hours as needed for nausea.        Disposition:  Home with hospice.   Discharged Condition: [name] has met maximum benefit of inpatient care and is medically stable and cleared for discharge.  Patient is pending follow up as above.      Time spent on disposition:  Greater than 35 minutes.   SignedNickolas Madrid, NP 05/11/2016  3:35 PM Pager: [phone] or [phone]

## 2016-05-11 NOTE — Evaluation (Signed)
Clinical/Bedside Swallow Evaluation Patient Details  Name: [name] MRN: [id number] Date of Birth: 1951/03/04  Today's Date: 05/11/2016 Time: SLP Start Time (ACUTE ONLY): 0830 SLP Stop Time (ACUTE ONLY): 0854 SLP Time Calculation (min) (ACUTE ONLY): 24 min  Past Medical History:  Past Medical History:  Diagnosis Date  . Atrial fibrillation ([place])   . CAD (coronary artery disease)   . CKD (chronic kidney disease)   . CVA (cerebral vascular accident) ([place])   . Gout   . Hyperlipidemia   . Hypertension   . Hypothyroidism   . ICH (intracerebral hemorrhage) ([place])    Past Surgical History:  Past Surgical History:  Procedure Laterality Date  . CARDIAC CATHETERIZATION     HPI:  65 y.o.malewith history of CHF, HTN, HLD, hx of ischemic strokes and hx of right BG ICH in 2009 while on plavix, CKD, CAD, afib on ASA presenting with left sided weakness and aphasia. CT- large right MCA infarct with hemorrhagic transformation and midline shift.  Intubated 10/20, one-way extubation 10/24.  Pt's wife has met with Palliative Medicine and is leaning towards comfort care.    Assessment / Plan / Recommendation Clinical Impression  Pt with improved alertness, talking unintelligibly when cued, follows some simple commands, strong right gaze preference.  He was attentive to approaching spoon, able to masticate ice chips and initiate what appeared to be a delayed swallow response.  Teaspoons of water elicited a likely delayed response with overt coughing post-swallow.  Cues required to maintain attention/wakefulness.  He is currently not safe to start a PO diet and is a high aspiration risk given likely sensory deficits. D/W Mrs. Sharlett Iles, who has met with Palliative Medicine and is considering hospice.  For today, recommend allowing ice chips after oral care; continue NPO otherwise. SLP will return next date to determine if instrumental swallow study would assist with decision-making for Mrs.  Coltrin vs allowing comfort POs.  She agrees with plan; d/w RN as well.     Aspiration Risk  Severe aspiration risk    Diet Recommendation   NPO except ice chips after oral care  Medication Administration: Via alternative means    Other  Recommendations Oral Care Recommendations: Oral care QID;Oral care prior to ice chip/H20   Follow up Recommendations  (tba)      Frequency and Duration min 3x week  2 weeks       Prognosis Prognosis for Safe Diet Advancement: Guarded      Swallow Study   General Date of Onset: 05/06/16 HPI: 65 y.o.malewith history of CHF, HTN, HLD, hx of ischemic strokes and hx of right BG ICH in 2009 while on plavix, CKD, CAD, afib on ASA presenting with left sided weakness and aphasia. CT- large right MCA infarct with hemorrhagic transformation and midline shift.  Intubated 10/20, one-way extubation 10/24.  Pt's wife has met with Palliative Medicine and is leaning towards comfort care.  Type of Study: Bedside Swallow Evaluation Previous Swallow Assessment: has had evaluations after prior CVAs Diet Prior to this Study: NPO Temperature Spikes Noted: Yes Respiratory Status: Nasal cannula History of Recent Intubation: Yes Length of Intubations (days): 4 days Date extubated: 05/10/16 Behavior/Cognition: Alert Oral Cavity Assessment: Within Functional Limits Oral Care Completed by SLP: Recent completion by staff Oral Cavity - Dentition: Missing dentition Vision: Impaired for self-feeding (strong right gaze preference) Self-Feeding Abilities: Total assist Patient Positioning: Upright in bed Baseline Vocal Quality: Normal Volitional Cough: Strong Volitional Swallow: Able to elicit    Oral/Motor/Sensory Function  Overall Oral Motor/Sensory Function: Other (comment) (tongue midline; general symmetry face)   Ice Chips Ice chips: Impaired Presentation: Spoon Oral Phase Functional Implications: Prolonged oral transit Pharyngeal Phase Impairments: Suspected  delayed Swallow   Thin Liquid Thin Liquid: Impaired Pharyngeal  Phase Impairments: Suspected delayed Swallow;Cough - Immediate    Nectar Thick Nectar Thick Liquid: Not tested   Honey Thick Honey Thick Liquid: Not tested   Puree Puree: Not tested   Solid   GO  [name], [place] CCC/SLP Pager [phone]  Solid: Not tested        [name] 05/11/2016,9:08 AM

## 2016-05-11 NOTE — Progress Notes (Signed)
Phone call made to patients wife to inform her that patient has discharged and is on his way home with EMS however there was no answer.   Gaspar Colaalled Cathy, RN of Hospice to inform them that [facility], transporting home.

## 2016-05-11 NOTE — Progress Notes (Addendum)
PULMONARY / CRITICAL CARE MEDICINE   Name: [name] MRN: [id number] DOB: 08/11/1950    ADMISSION DATE:  05/06/2016  CHIEF COMPLAINT:  Stroke  BRIEF: 65 yo [facility] after presenting with Lt sided weakness.  He had previous stroke with Lt sided weakness.  CT head showed large vessel occlusion of proximal Rt M1 segment.  There was concern for flash pulmonary edema and he was intubated.  [facility].  SUBJECTIVE:  No acute events overnight. Hypertonic saline off this morning with sodium of 156. Patient still confused and continues to have aphasia. Did not pass swallowing evaluation.  REVIEW OF SYSTEMS:  Unable to obtain with aphasia.  VITAL SIGNS: BP (!) 158/90 (BP Location: Left Arm)   Pulse 99   Temp (!) 101 F (38.3 C) (Oral)   Resp (!) 30   Ht 5\' 9"  (1.753 m)   Wt 173 lb 1 oz (78.5 kg)   SpO2 97%   BMI 25.56 kg/m   HEMODYNAMICS:    VENTILATOR SETTINGS:    INTAKE / OUTPUT: I/O last 3 completed shifts: In: 4070.2 [I.V.:1610.2; Other:1000; NG/GT:910; IV Piggyback:550] Out: 3625 [Urine:3525; Stool:100]  PHYSICAL EXAMINATION: General:  No distress. Wife and sister at bedside. Eyes open. Neuro: Not following commands. Non-sensible speech. Moving right upper extremity at well. HEENT: No scleral icterus or injection. Moist mucous membranes. Cardiovascular: Regular rate and rhythm. No edema. Lungs: Clear with auscultation. Normal work of breathing on room air.  Abdomen:  Soft.nondistended. Normal bowel sounds.  Integument: Warm and dry. No rash on exposed skin.   LABS:  BMET  Recent Labs Lab 05/10/16 0205  05/10/16 1805 05/11/16 0200 05/11/16 0545 05/11/16 0800  NA 152*  < > 156* 156* 156* 155*  K 3.4*  --  4.0  --  4.0  --   CL 121*  --  125*  --  123*  --   CO2 24  --  24  --  24  --   BUN 20  --  19  --  22*  --   CREATININE 1.53*  --  1.43*  --  1.37*  --   GLUCOSE 122*  --  111*   --  110*  --   < > = values in this interval not displayed.  Electrolytes  Recent Labs Lab 05/09/16 0415 05/10/16 0205 05/10/16 0705 05/10/16 1805 05/11/16 0200 05/11/16 0545  CALCIUM 8.0* 7.7*  --  8.4*  --  8.7*  MG 1.9  --  1.8  --  2.1  --   PHOS 1.9*  --  3.1  --   --  2.8    CBC  Recent Labs Lab 05/09/16 0415 05/10/16 0205 05/11/16 0200  WBC 8.4 6.6 8.4  HGB 14.4 13.3 14.3  HCT 42.4 39.6 42.6  PLT 161 157 169    Coag's  Recent Labs Lab 05/06/16 0955 05/07/16 0430  APTT 39*  --   INR  --  1.41    Sepsis Markers  Recent Labs Lab 05/07/16 1510 05/08/16 0315 05/09/16 0415  PROCALCITON 0.40 0.34 0.32    ABG  Recent Labs Lab 05/06/16 0708 05/06/16 1011  PHART 7.307* 7.434  PCO2ART 51.7* 34.5  PO2ART 228.0* 60.0*    Liver Enzymes  Recent Labs Lab 05/06/16 0830 05/11/16 0545  AST 50*  --   ALT 20  --   ALKPHOS 93  --   BILITOT 1.3*  --  ALBUMIN 2.9* 2.2*    Cardiac Enzymes  Recent Labs Lab 05/10/16 0705 05/10/16 1255 05/10/16 1805  TROPONINI 0.60* 0.49* 0.42*    Glucose  Recent Labs Lab 05/10/16 1136 05/10/16 1537 05/10/16 1955 05/11/16 0001 05/11/16 0446 05/11/16 0747  GLUCAP 99 87 75 81 105* 97    Imaging No results found.  STUDIES:  CT Head 10/20: large vessel occlusion of proximal Rt M1 segment.   CT Head 10/22: evolution of large R MCA stroke, now with significant swelling/ edema, mass effect and peripheral petechial hemorrhage. Hemorrhage within the right lenticular nucleus/ corona radiata. Mass effect upon the right lateral ventricle with 4.3 mm of midline shift to the left. CT Head 10/24:  Evolving large RIGHT MCA territory infarct with similar focal hemorrhagic conversion. Worsening edema, resulting in 7 mm RIGHT to LEFT midline shift. No LEFT ventricle entrapment. TTE 10/23:  EF 30-35%. Severe hypokinesis of the inferior lateral & inferior myocardium with mild hypokinesis of the entire anteroseptal  myocardium. LA moderately dilated & RA mildly dilated. RV normal in size and function. No aortic stenosis or regurgitation. Severe mitral regurgitation without stenosis. No pulmonic stenosis. Mild tricuspid regurgitation. No pericardial effusion.  MICROBIOLOGY: MRSA PCR 10/20:  Negative Tracheal Asp Ctx 10/22:  MSSA & Enterococcus Blood Ctx x2 10/22 >>  ANTIBIOTICS: Unasyn 10/21 - 10/24 Cefepime 10/24 >>  SIGNIFICANT EVENTS: 10/20  transfer from [place], neuro consulted 10/21  Waking, following commands  LINES/TUBES: OETT 10/20 - 10/24 OGT 10/20 - 10/24 L IJ CVL 10/20 >>  Foley 10/20 >> PIV x3  ASSESSMENT / PLAN:  NEUROLOGIC A:   Acute CVA - H/O prior CVA & Rt thalamic ICH. Hemorrhagic Transformation w/ Midline Shift - Worsening on 10/24 CT.   P:   Neurology Following & appreciate recommendations 3% NS infusion - Na goal 150-155 Morphine IV prn pain PT/OT Consulted & Following  PULMONARY A: Acute  Hypoxic Respiratory Failure - Resolved. Multifactorial w/ some pulmonary edema. Extubated 10/24. MSSA & Enterobacter Pneumonia  P:   Continuous Pulse Oximetry See ID   CARDIOVASCULAR A:  Wide Complex Tachycardia  - Appears as V tach on telemetry 10/24. H/O Atrial Fibrillation - Not on anticoagulation due to prior ICH. H/O HTN  P:  Continuing telemetry monitoring Vitals per unit protocol Holding Lasix, Lopressor, & Amiodarone Hydralazine & Labetalol IV prn  RENAL A:   Hypernatremia - Mild & stable. Secondary to 3% HS. Hypokalemia - Resolved. Hypophosphatemia - Resolved. H/O CKD  P:   Monitoring UOP Trending renal function & electrolytes daily Replacing electrolytes as indicated  GASTROINTESTINAL A:   Severe Aspiration Risk - Per Speech Therapy Eval.  P:   NPO for now  D/C Pepcid  HEMATOLOGIC A:   No acute issues.  P:  Trending cell counts daily w/ CBC SCDs  INFECTIOUS A:   MSSA/Enteroccus Pneumonia Fever - Likely due to ICH.  P:    Cefepime Day #2 of 5  ENDOCRINE A:   H/O hypothyroidism   P:   Holding Synthroid   TODAY'S SUMMARY:  65 y.o. male presented to [facility] ER after collapsing at home and had worsening Lt sided weakness.  CT head showed proximal Rt M1 occlusion.  She developed hemorrhagic conversion and increasing intracranial pressure requiring hypertonic saline. Patient has successfully now been extubated. Continues to have a facial and did not pass swallowing evaluation. Discussed patient's plan of care at length with wife and sister at bedside. Hospice is coming for evaluation today and likely discharge to  home with ongoing hospice care. Given the patient's pneumonia I would favor continuing a 5 day course of cefepime IV. We did discuss removing his Foley catheter and central venous catheter prior to discharge from hospital to prevent further complications. We also discussed allowing pleasure foods as the patient desires recognizing his high potential for aspiration and pneumonia. Given his nothing by mouth status I am holding his home amiodarone and Lopressor. Given his excellent respiratory status I'm holding off on further Lasix at this time. We will anticipate discharge to home with hospice care today.   Donna Christen Jamison Neighbor, M.D. [facility] Pulmonary & Critical Care Pager:  [phone] After 3pm or if no response, call [phone] 05/11/2016 9:25 AM

## 2016-05-11 NOTE — Progress Notes (Signed)
Will be hospice once gets home and signs up

## 2016-05-12 DIAGNOSIS — Z515 Encounter for palliative care: Secondary | ICD-10-CM

## 2016-05-12 LAB — GLUCOSE, CAPILLARY: Glucose-Capillary: 181 mg/dL — ABNORMAL HIGH (ref 65–99)

## 2016-05-13 LAB — CULTURE, BLOOD (ROUTINE X 2)
CULTURE: NO GROWTH
Culture: NO GROWTH

## 2016-08-18 DEATH — deceased
